# Patient Record
Sex: Male | Born: 1935 | Race: White | Hispanic: No | Marital: Single | Smoking: Never smoker
Health system: Southern US, Community
[De-identification: ages and names within clinical notes are randomized; demographics above are authoritative.]

## PROBLEM LIST (undated history)

## (undated) DIAGNOSIS — Z974 Presence of external hearing-aid: Secondary | ICD-10-CM

## (undated) DIAGNOSIS — Z85038 Personal history of other malignant neoplasm of large intestine: Secondary | ICD-10-CM

## (undated) DIAGNOSIS — E039 Hypothyroidism, unspecified: Secondary | ICD-10-CM

## (undated) DIAGNOSIS — E785 Hyperlipidemia, unspecified: Secondary | ICD-10-CM

## (undated) DIAGNOSIS — Z972 Presence of dental prosthetic device (complete) (partial): Secondary | ICD-10-CM

## (undated) DIAGNOSIS — E079 Disorder of thyroid, unspecified: Secondary | ICD-10-CM

## (undated) DIAGNOSIS — N400 Enlarged prostate without lower urinary tract symptoms: Secondary | ICD-10-CM

## (undated) DIAGNOSIS — H532 Diplopia: Secondary | ICD-10-CM

## (undated) HISTORY — DX: Hyperlipidemia, unspecified: E78.5

## (undated) HISTORY — DX: Disorder of thyroid, unspecified: E07.9

## (undated) HISTORY — PX: HERNIA REPAIR: SHX51

## (undated) HISTORY — DX: Diplopia: H53.2

## (undated) HISTORY — DX: Benign prostatic hyperplasia without lower urinary tract symptoms: N40.0

## (undated) HISTORY — DX: Personal history of other malignant neoplasm of large intestine: Z85.038

---

## 1984-12-25 DIAGNOSIS — H532 Diplopia: Secondary | ICD-10-CM

## 1984-12-25 HISTORY — DX: Diplopia: H53.2

## 1991-12-26 HISTORY — PX: TURP VAPORIZATION: SUR1397

## 1999-03-26 HISTORY — PX: PARTIAL COLECTOMY: SHX5273

## 2006-05-08 ENCOUNTER — Ambulatory Visit: Payer: Self-pay | Admitting: Internal Medicine

## 2006-06-07 ENCOUNTER — Ambulatory Visit: Payer: Self-pay | Admitting: Internal Medicine

## 2006-10-03 ENCOUNTER — Ambulatory Visit: Payer: Self-pay | Admitting: Internal Medicine

## 2007-05-10 ENCOUNTER — Telehealth: Payer: Self-pay | Admitting: Internal Medicine

## 2007-05-17 DIAGNOSIS — E039 Hypothyroidism, unspecified: Secondary | ICD-10-CM | POA: Insufficient documentation

## 2007-05-17 DIAGNOSIS — Z85038 Personal history of other malignant neoplasm of large intestine: Secondary | ICD-10-CM | POA: Insufficient documentation

## 2007-05-17 DIAGNOSIS — N400 Enlarged prostate without lower urinary tract symptoms: Secondary | ICD-10-CM | POA: Insufficient documentation

## 2007-05-17 DIAGNOSIS — E785 Hyperlipidemia, unspecified: Secondary | ICD-10-CM

## 2007-05-22 ENCOUNTER — Ambulatory Visit: Payer: Self-pay | Admitting: Internal Medicine

## 2007-05-23 LAB — CONVERTED CEMR LAB
Free T4: 0.8 ng/dL (ref 0.6–1.6)
TSH: 0.18 microintl units/mL — ABNORMAL LOW (ref 0.35–5.50)

## 2007-06-07 ENCOUNTER — Encounter: Payer: Self-pay | Admitting: Internal Medicine

## 2007-06-17 ENCOUNTER — Ambulatory Visit: Payer: Self-pay | Admitting: General Surgery

## 2007-06-17 ENCOUNTER — Encounter: Payer: Self-pay | Admitting: Internal Medicine

## 2007-06-20 ENCOUNTER — Encounter: Payer: Self-pay | Admitting: Internal Medicine

## 2007-06-26 ENCOUNTER — Encounter: Payer: Self-pay | Admitting: Internal Medicine

## 2007-07-05 ENCOUNTER — Encounter: Payer: Self-pay | Admitting: Internal Medicine

## 2007-07-08 ENCOUNTER — Ambulatory Visit: Payer: Self-pay | Admitting: General Surgery

## 2007-07-08 ENCOUNTER — Other Ambulatory Visit: Payer: Self-pay

## 2007-07-15 ENCOUNTER — Encounter: Payer: Self-pay | Admitting: Internal Medicine

## 2007-07-15 ENCOUNTER — Ambulatory Visit: Payer: Self-pay | Admitting: General Surgery

## 2007-07-29 ENCOUNTER — Encounter: Payer: Self-pay | Admitting: Internal Medicine

## 2007-10-14 ENCOUNTER — Ambulatory Visit: Payer: Self-pay | Admitting: Internal Medicine

## 2008-02-07 ENCOUNTER — Encounter: Payer: Self-pay | Admitting: Internal Medicine

## 2008-05-22 ENCOUNTER — Ambulatory Visit: Payer: Self-pay | Admitting: Internal Medicine

## 2008-05-25 LAB — CONVERTED CEMR LAB
BUN: 18 mg/dL (ref 6–23)
Bilirubin, Direct: 0.1 mg/dL (ref 0.0–0.3)
Chloride: 110 meq/L (ref 96–112)
Eosinophils Absolute: 0.2 10*3/uL (ref 0.0–0.7)
Eosinophils Relative: 3 % (ref 0.0–5.0)
Free T4: 1 ng/dL (ref 0.6–1.6)
GFR calc Af Amer: 104 mL/min
GFR calc non Af Amer: 86 mL/min
HCT: 41.1 % (ref 39.0–52.0)
HDL: 29.1 mg/dL — ABNORMAL LOW (ref >39.0)
MCV: 93.1 fL (ref 78.0–100.0)
Monocytes Absolute: 0.7 10*3/uL (ref 0.1–1.0)
Neutro Abs: 3.6 10*3/uL (ref 1.4–7.7)
Platelets: 174 10*3/uL (ref 150–400)
Potassium: 4.5 meq/L (ref 3.5–5.1)
RDW: 12.6 % (ref 11.5–14.6)
Sodium: 142 meq/L (ref 135–145)
TSH: 0.21 microintl units/mL — ABNORMAL LOW (ref 0.35–5.50)
Total Protein: 6.9 g/dL (ref 6.0–8.3)
Triglycerides: 138 mg/dL (ref 0–149)
WBC: 5.5 10*3/uL (ref 4.5–10.5)

## 2008-09-23 ENCOUNTER — Ambulatory Visit: Payer: Self-pay | Admitting: Internal Medicine

## 2008-12-21 ENCOUNTER — Encounter: Payer: Self-pay | Admitting: Internal Medicine

## 2009-01-29 ENCOUNTER — Ambulatory Visit: Payer: Self-pay | Admitting: Internal Medicine

## 2009-01-29 DIAGNOSIS — H9190 Unspecified hearing loss, unspecified ear: Secondary | ICD-10-CM | POA: Insufficient documentation

## 2009-02-01 LAB — CONVERTED CEMR LAB
ALT: 18 units/L (ref 0–53)
AST: 23 units/L (ref 0–37)
Alkaline Phosphatase: 99 units/L (ref 39–117)
CO2: 22 meq/L (ref 19–32)
Creatinine, Ser: 0.79 mg/dL (ref 0.40–1.50)
Free T4: 0.85 ng/dL — ABNORMAL LOW (ref 0.89–1.80)
LDL Cholesterol: 47 mg/dL (ref 0–99)
Sodium: 140 meq/L (ref 135–145)
TSH: 0.645 microintl units/mL (ref 0.350–4.50)
Total Bilirubin: 0.8 mg/dL (ref 0.3–1.2)
Total CHOL/HDL Ratio: 3.9
Total Protein: 7.6 g/dL (ref 6.0–8.3)
VLDL: 47 mg/dL — ABNORMAL HIGH (ref 0–40)

## 2009-07-05 ENCOUNTER — Telehealth: Payer: Self-pay | Admitting: Internal Medicine

## 2009-09-30 ENCOUNTER — Ambulatory Visit: Payer: Self-pay | Admitting: Internal Medicine

## 2010-02-01 ENCOUNTER — Ambulatory Visit: Payer: Self-pay | Admitting: Internal Medicine

## 2010-02-09 LAB — CONVERTED CEMR LAB
ALT: 23 units/L (ref 0–53)
AST: 31 units/L (ref 0–37)
Albumin: 4 g/dL (ref 3.5–5.2)
Alkaline Phosphatase: 76 units/L (ref 39–117)
BUN: 15 mg/dL (ref 6–23)
Basophils Absolute: 0 10*3/uL (ref 0.0–0.1)
Basophils Relative: 0.2 % (ref 0.0–3.0)
Chloride: 109 meq/L (ref 96–112)
Eosinophils Absolute: 0.1 10*3/uL (ref 0.0–0.7)
Free T4: 0.9 ng/dL (ref 0.6–1.6)
GFR calc non Af Amer: 85.61 mL/min (ref >60)
Glucose, Bld: 107 mg/dL — ABNORMAL HIGH (ref 70–99)
HDL: 38.6 mg/dL — ABNORMAL LOW (ref >39.00)
Lymphocytes Relative: 17.9 % (ref 12.0–46.0)
MCHC: 33.3 g/dL (ref 30.0–36.0)
MCV: 97 fL (ref 78.0–100.0)
Monocytes Absolute: 0.5 10*3/uL (ref 0.1–1.0)
Neutrophils Relative %: 68.8 % (ref 43.0–77.0)
Phosphorus: 2.3 mg/dL (ref 2.3–4.6)
Potassium: 4.5 meq/L (ref 3.5–5.1)
RDW: 12.6 % (ref 11.5–14.6)
Total Bilirubin: 1.3 mg/dL — ABNORMAL HIGH (ref 0.3–1.2)
Total CHOL/HDL Ratio: 3

## 2010-06-14 ENCOUNTER — Telehealth: Payer: Self-pay | Admitting: Internal Medicine

## 2010-06-17 ENCOUNTER — Encounter: Payer: Self-pay | Admitting: Internal Medicine

## 2010-06-17 ENCOUNTER — Telehealth: Payer: Self-pay | Admitting: Internal Medicine

## 2010-07-08 ENCOUNTER — Ambulatory Visit: Payer: Self-pay | Admitting: General Surgery

## 2010-07-08 ENCOUNTER — Encounter: Payer: Self-pay | Admitting: Internal Medicine

## 2010-10-25 ENCOUNTER — Ambulatory Visit: Payer: Self-pay | Admitting: Family Medicine

## 2010-10-25 DIAGNOSIS — J069 Acute upper respiratory infection, unspecified: Secondary | ICD-10-CM | POA: Insufficient documentation

## 2011-01-24 NOTE — Progress Notes (Signed)
Summary: clarification on levothyroxine  Phone Note From Pharmacy   Caller: [facility] Summary of Call: [facility] is asking for clarification on levothyroxine script.  Form is on your desk for signature. Initial call taken by: Lowella Petties CMA,  June 17, 2010 12:12 PM  Follow-up for Phone Call        original cut off corrected and signed Follow-up by: Cindee Salt MD,  June 17, 2010 1:39 PM  Additional Follow-up for Phone Call Additional follow up Details #1::        Form faxed back to [facility] Additional Follow-up by: Mervin Hack CMA Duncan Dull),  June 17, 2010 3:17 PM

## 2011-01-24 NOTE — Procedures (Signed)
Summary: Colonoscopy by Dr[facility]Jeffrey [facility]  Colonoscopy by Dr.Jeffrey Byrnett,[facility]   Imported By: Beau Fanny 07/13/2010 16:29:16  _____________________________________________________________________  External Attachment:    Type:   Image     Comment:   External Document  Appended Document: Colonoscopy by Dr.Jeffrey [facility]    Clinical Lists Changes  Observations: Added new observation of COLONOSCOPY: Results: Diverticulosis.        No evidence of recurrent cancer Anastamosis normal (07/08/2010 19:10)       Colonoscopy  Procedure date:  07/08/2010  Findings:      Results: Diverticulosis.        No evidence of recurrent cancer Anastamosis normal

## 2011-01-24 NOTE — Assessment & Plan Note (Signed)
Summary: ONE YEAR FOLLOW UP / LFW   Vital Signs:  Patient profile:   77 year old male Height:      67 inches Weight:      187 pounds BMI:     29.39 Temp:     98.4 degrees F oral Pulse rate:   68 / minute Pulse rhythm:   irregular BP sitting:   128 / 68  (left arm) Cuff size:   large  Vitals Entered By: Mervin Hack CMA Duncan Dull) (February 01, 2010 9:20 AM) CC: follow-up visit   History of Present Illness: Doing fine Occ gets dizzy if he jumps up out of bed or chair some trouble if he bends over too far Takes daily walk in AM  Voids okay nocturia x 1-2 No urgency or frequency in day  Still on chol med LDL was 47 on blood work discussed the uncertainty about primary prevention he wishes to continue  thyroid borderline low last year will increase this year if still down generally doesn't miss doese  Allergies: No Known Drug Allergies  Past History:  Past medical, surgical, family and social histories (including risk factors) reviewed for relevance to current acute and chronic problems.  Past Medical History: Reviewed history from 05/17/2007 and no changes required. Colon cancer, hx of Hyperlipidemia Hypothyroidism Benign prostatic hypertrophy  Past Surgical History: Reviewed history from 05/22/2008 and no changes required. Partial colectomy (Byrnett)  03/1999  chemo with Choksi TURP 1993 Concussion/diplopia 1986 Left inguinal hernia  7/08  Family History: Reviewed history from 05/17/2007 and no changes required. Dad died @66  MI Mom died @59  MI 1 brother deceased 3 sisters--1 with Altzheimers, 1 died at birth, 1 died of DM & ?liver failure  Social History: Reviewed history from 05/22/2008 and no changes required. 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   Neck:  supple, no masses, no thyromegaly, no carotid bruits, and no cervical lymphadenopathy.   Lungs:  normal respiratory effort and normal breath sounds.   Heart:  normal rate, regular rhythm, no murmur, and no gallop.   Abdomen:  soft and non-tender.   Msk:  no joint tenderness.   Very slight synovial thickening in DIP and PIPs Pulses:  1+ in feet Extremities:  no edema Neurologic:  alert & oriented X3, strength normal in all extremities, and gait normal.   Psych:  normally interactive, good eye contact, not anxious appearing, and not depressed appearing.     Impression & Recommendations:  Problem # 1:  HYPOTHYROIDISM (ICD-244.9) Assessment Unchanged  may need increase will check labs  His updated medication list for this problem includes:    Levothyroxine Sodium 100 Mcg Tabs (Levothyroxine sodium) .Marland Kitchen... 1 daily  Labs Reviewed: TSH: 0.645 (01/29/2009)    Chol: 126 (01/29/2009)   HDL: 32 (01/29/2009)   LDL: 47 (01/29/2009)   TG: 235 (01/29/2009)  Orders: TLB-T4 (Thyrox), Free [phone]) TLB-TSH (Thyroid Stimulating Hormone) (84443-TSH) TLB-CBC Platelet - w/Differential (85025-CBCD) TLB-Renal Function Panel (80069-RENAL)  Problem # 2:  BENIGN PROSTATIC HYPERTROPHY (ICD-600.00) Assessment: Unchanged voids okay without meds  Problem # 3:  HYPERLIPIDEMIA (ICD-272.4) Assessment: Unchanged  he is comfortable continuing meds discussed that 1/2 tab daily may be enough  His updated medication list for this problem includes:    Simvastatin 40 Mg Tabs (Simvastatin) .Marland Kitchen... 1 daily  Labs Reviewed: SGOT: 23 (01/29/2009)   SGPT: 18 (01/29/2009)   HDL:32 (01/29/2009), 29.1 (05/22/2008)  LDL:47 (01/29/2009), 56 (05/22/2008)  Chol:126 (01/29/2009), 113 (05/22/2008)  Trig:235 (01/29/2009), 138 (05/22/2008)  Orders: TLB-Lipid Panel (80061-LIPID) TLB-Hepatic/Liver Function Pnl  (80076-HEPATIC) Venipuncture (78295)  Problem # 4:  COLON CANCER, HX OF (ICD-V10.05) Assessment: Comment Only due for colonoscopy with Dr Lemar Livings this year  Complete Medication List: 1)  Simvastatin 40 Mg Tabs (Simvastatin) .Marland Kitchen.. 1 daily 2)  Levothyroxine Sodium 100 Mcg Tabs (Levothyroxine sodium) .Marland Kitchen.. 1 daily 3)  Aspirin 81 Mg Tbec (Aspirin) .... Take 1 by mouth once daily  Patient Instructions: 1)  Please schedule a follow-up appointment in 1 year.   Current Allergies (reviewed today): No known allergies

## 2011-01-24 NOTE — Medication Information (Signed)
Summary: Clarification/[facility]  Clarification/[facility]   Imported By: Lester Berwind 06/23/2010 12:01:14  _____________________________________________________________________  External Attachment:    Type:   Image     Comment:   External Document

## 2011-01-24 NOTE — Progress Notes (Signed)
Summary:  LEVOTHYROXINE SODIUM 100 MCG  Phone Note Refill Request Call back at Home Phone [phone] Message from:  Patient on June 14, 2010 4:40 PM  Refills Requested: Medication #1:  LEVOTHYROXINE SODIUM 100 MCG TABS 1 daily  Method Requested: Pick up at Office Initial call taken by: Melody Comas,  June 14, 2010 4:40 PM  Follow-up for Phone Call        Let him know this is $10 at [facility], [employer] and [employer] okay to send off if he prefers Follow-up by: Cindee Salt MD,  June 15, 2010 7:51 AM  Additional Follow-up for Phone Call Additional follow up Details #1::        left message on machine that rx ready for pick-up  Additional Follow-up by: DeShannon Smith CMA Duncan Dull),  June 15, 2010 8:23 AM    New/Updated Medications: LEVOTHYROXINE SODIUM 100 MCG TABS (LEVOTHYROXINE SODIUM) 1 tab daily for underactive thyroid Prescriptions: LEVOTHYROXINE SODIUM 100 MCG TABS (LEVOTHYROXINE SODIUM) 1 tab daily for underactive thyroid  #90 x 3   Entered and Authorized by:   Cindee Salt MD   Signed by:   Cindee Salt MD on 06/15/2010   Method used:   Print then Give to Patient   RxID:   [id number]

## 2011-01-24 NOTE — Assessment & Plan Note (Signed)
Summary: COLD/DLO   Vital Signs:  Patient profile:   77 year old male Weight:      184 pounds Temp:     99.1 degrees F oral Pulse rate:   80 / minute Pulse rhythm:   regular BP sitting:   136 / 78  (left arm) Cuff size:   large  Vitals Entered By: Selena Batten Dance CMA (AAMA) (October 25, 2010 3:23 PM) CC: Cold symptoms x1 week   History of Present Illness: CC: cold  8-9 d h/o cold sxs.  At night bad cough, subsides in daytime. Phlegm still present but resolving on own.  Cough productive of sputum.  + nasal congestion.  + sneezing.    No tooth pain, ear pain.  Mild chills, no fevers.  No ST, abd pain, n/v, rash, joint or muscle pain.  Taking tylenol extra strength.  No h/o alleriges or asthma.  + sick contacts at mall.  no smokers at home.  Lives alone with dog.    Current Medications (verified): 1)  Simvastatin 40 Mg Tabs (Simvastatin) .Marland Kitchen.. 1 Daily 2)  Levothyroxine Sodium 100 Mcg Tabs (Levothyroxine Sodium) .Marland Kitchen.. 1 Tab Daily For Underactive Thyroid 3)  Aspirin 81 Mg Tbec (Aspirin) .... 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   Head:  Normocephalic and atraumatic without obvious abnormalities. No apparent alopecia or balding.  no sinus tenderness Eyes:  No corneal or conjunctival inflammation noted. EOMI. Perrla. Ears:  External ear exam shows no significant lesions or deformities.  Otoscopic examination reveals clear canals, tympanic membranes are intact bilaterally without bulging, retraction, inflammation or  discharge. Hearing is grossly normal bilaterally. Nose:  External nasal examination shows no deformity or inflammation. Nasal mucosa are pink and moist without lesions or exudates. Mouth:  Oral mucosa and oropharynx without lesions or exudates.  Teeth in good repair. Neck:  supple, no masses, no thyromegaly, no carotid bruits, and no cervical lymphadenopathy.   Lungs:  Normal respiratory effort, chest expands symmetrically. Lungs are clear to auscultation, no crackles or wheezes. Heart:  normal rate, regular rhythm, no murmur, and no gallop.   Abdomen:  soft and non-tender.   Pulses:  2+ rad pulses Extremities:  good cap refill, no cyanosis Skin:  Intact without suspicious lesions or rashes   Impression & Recommendations:  Problem # 1:  VIRAL URI (ICD-465.9) given going on for 8 days, WASP script in case not better by weekend.  supportive care for now as per instructions.  red flags to return discussed  His updated medication list for this problem includes:    Aspirin 81 Mg Tbec (Aspirin) .Marland Kitchen... Take 1 by mouth once daily  Complete Medication List: 1)  Simvastatin 40 Mg Tabs (Simvastatin) .Marland Kitchen.. 1 daily 2)  Levothyroxine Sodium 100 Mcg Tabs (Levothyroxine sodium) .Marland Kitchen.. 1 tab daily for underactive thyroid 3)  Aspirin 81 Mg Tbec (Aspirin) .... Take 1 by mouth once daily 4)  Zithromax Z-pak 250 Mg Tabs (Azithromycin) .... Take one as directed  Patient Instructions: 1)  Sounds like you have a viral upper respiratory infection. 2)  Antibiotics are not needed for this.  Viral infections usually take 7-10 days to resolve.  The cough can last up to 4-6 weeks to go away.  Prescription to hold on to in case not improved by end of week. 3)  Nasal saline spray.  could try some mucinex to help bring up sputum in cough. 4)  Increase fluids and get plenty of rest. 5)  Please return if you are not improving as expected, or if you have high fevers (>101.5) or difficulty swallowing. 6)  Call clinic with  questions.  Pleasure to see you today!  Prescriptions: ZITHROMAX Z-PAK 250 MG TABS (AZITHROMYCIN) take one as directed  #1 x 0   Entered and Authorized by:   Eustaquio Boyden  MD   Signed by:   Eustaquio Boyden  MD on 10/25/2010   Method used:   Print then Give to Patient   RxID:   [id number]    Orders Added: 1)  Est. Patient Level III [56213]    Current Allergies (reviewed today): No known allergies

## 2011-02-08 ENCOUNTER — Ambulatory Visit (INDEPENDENT_AMBULATORY_CARE_PROVIDER_SITE_OTHER): Payer: Medicare Other | Admitting: Internal Medicine

## 2011-02-08 ENCOUNTER — Other Ambulatory Visit: Payer: Self-pay | Admitting: Internal Medicine

## 2011-02-08 ENCOUNTER — Encounter: Payer: Self-pay | Admitting: Internal Medicine

## 2011-02-08 DIAGNOSIS — E039 Hypothyroidism, unspecified: Secondary | ICD-10-CM

## 2011-02-08 DIAGNOSIS — E785 Hyperlipidemia, unspecified: Secondary | ICD-10-CM

## 2011-02-08 DIAGNOSIS — N400 Enlarged prostate without lower urinary tract symptoms: Secondary | ICD-10-CM

## 2011-02-08 LAB — RENAL FUNCTION PANEL
BUN: 15 mg/dL (ref 6–23)
CO2: 30 mEq/L (ref 19–32)
Creatinine, Ser: 0.9 mg/dL (ref 0.4–1.5)
GFR: 87.65 mL/min (ref >60.00)
Glucose, Bld: 91 mg/dL (ref 70–99)
Phosphorus: 2.8 mg/dL (ref 2.3–4.6)

## 2011-02-08 LAB — LIPID PANEL
HDL: 33 mg/dL — ABNORMAL LOW (ref >39.00)
LDL Cholesterol: 62 mg/dL (ref 0–99)
Total CHOL/HDL Ratio: 4
VLDL: 39.2 mg/dL (ref 0.0–40.0)

## 2011-02-08 LAB — HEPATIC FUNCTION PANEL
AST: 22 U/L (ref 0–37)
Alkaline Phosphatase: 84 U/L (ref 39–117)
Bilirubin, Direct: 0.2 mg/dL (ref 0.0–0.3)

## 2011-02-08 LAB — TSH: TSH: 0.66 u[IU]/mL (ref 0.35–5.50)

## 2011-02-09 LAB — CBC WITH DIFFERENTIAL/PLATELET
Basophils Absolute: 0 10*3/uL (ref 0.0–0.1)
Eosinophils Absolute: 0.2 10*3/uL (ref 0.0–0.7)
HCT: 39.9 % (ref 39.0–52.0)
Hemoglobin: 13.9 g/dL (ref 13.0–17.0)
Lymphs Abs: 1 10*3/uL (ref 0.7–4.0)
MCHC: 34.9 g/dL (ref 30.0–36.0)
MCV: 94.9 fl (ref 78.0–100.0)
Monocytes Absolute: 0.6 10*3/uL (ref 0.1–1.0)
Neutro Abs: 3.5 10*3/uL (ref 1.4–7.7)
RDW: 14.1 % (ref 11.5–14.6)

## 2011-02-15 NOTE — Assessment & Plan Note (Signed)
Summary: 1 YR F/U/CLE   Vital Signs:  Patient profile:   77 year old male Weight:      187 pounds BMI:     29.39 Temp:     98.6 degrees F oral Pulse rate:   71 / minute Pulse rhythm:   regular BP sitting:   155 / 72  (left arm) Cuff size:   large  Vitals Entered By: Mervin Hack CMA Duncan Dull) (February 08, 2011 8:34 AM) CC: follow-up visit   History of Present Illness: Doing well No new concerns  Feels he has slowed down a bit---doesn't walk as fast stays active though--walks  ~ every day  No chest pain No SOB No edema  No stomach trouble or myalgias on the statin  Voids okay Nocturia x 2  usually No sig daytime problems  Allergies: No Known Drug Allergies  Past History:  Past medical, surgical, family and social histories (including risk factors) reviewed for relevance to current acute and chronic problems.  Past Medical History: Reviewed history from 05/17/2007 and no changes required. Colon cancer, hx of Hyperlipidemia Hypothyroidism Benign prostatic hypertrophy  Past Surgical History: Reviewed history from 05/22/2008 and no changes required. Partial colectomy (Byrnett)  03/1999  chemo with Choksi TURP 1993 Concussion/diplopia 1986 Left inguinal hernia  7/08  Family History: Reviewed history from 05/17/2007 and no changes required. Dad died @66  MI Mom died @59  MI 1 brother deceased 3 sisters--1 with Altzheimers, 1 died at birth, 1 died of DM & ?liver failure  Social History: Reviewed history from 05/22/2008 and no changes required. 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   Neck:  supple, no masses, no thyromegaly, no carotid bruits, and no cervical lymphadenopathy.   Lungs:  normal respiratory  effort, no intercostal retractions, no accessory muscle use, and normal breath sounds.   Heart:  normal rate, regular rhythm, no murmur, and no gallop.   Abdomen:  soft, non-tender, and no masses.   Extremities:  no edema Psych:  normally interactive, good eye contact, not anxious appearing, and not depressed appearing.     Impression & Recommendations:  Problem # 1:  HYPOTHYROIDISM (ICD-244.9) Assessment Unchanged  clinically euthyroid  His updated medication list for this problem includes:    Levothyroxine Sodium 100 Mcg Tabs (Levothyroxine sodium) .Marland Kitchen... 1 tab daily for underactive thyroid  Labs Reviewed: TSH: 0.59 (02/01/2010)    Chol: 111 (02/01/2010)   HDL: 38.60 (02/01/2010)   LDL: 43 (02/01/2010)   TG: 145.0 (02/01/2010)  Orders: TLB-T4 (Thyrox), Free [phone]) Venipuncture (40981) TLB-TSH (Thyroid Stimulating Hormone) (84443-TSH) TLB-CBC Platelet - w/Differential (85025-CBCD) TLB-Renal Function Panel (80069-RENAL)  Problem # 2:  HYPERLIPIDEMIA (ICD-272.4) Assessment: Unchanged  he would like to come off the meds discussed primary prevention If still low, will stop  His updated medication list for this problem includes:    Simvastatin 40 Mg Tabs (Simvastatin) .Marland Kitchen... Take 1/2 by mouth once daily  Labs Reviewed: SGOT: 31 (02/01/2010)   SGPT: 23 (02/01/2010)   HDL:38.60 (02/01/2010), 32 (01/29/2009)  LDL:43 (02/01/2010), 47 (01/29/2009)  Chol:111 (02/01/2010), 126 (01/29/2009)  Trig:145.0 (02/01/2010), 235 (01/29/2009)  Orders: TLB-Lipid Panel (80061-LIPID) TLB-Hepatic/Liver Function Pnl (80076-HEPATIC)  Problem # 3:  BENIGN PROSTATIC HYPERTROPHY (ICD-600.00) Assessment: Unchanged voding okay without meds  Complete Medication List:  1)  Simvastatin 40 Mg Tabs (Simvastatin) .... Take 1/2 by mouth once daily 2)  Levothyroxine Sodium 100 Mcg Tabs (Levothyroxine sodium) .Marland Kitchen.. 1 tab daily for underactive thyroid 3)  Aspirin 81 Mg Tbec (Aspirin) .... Take 1 by mouth  once daily  Patient Instructions: 1)  Please schedule a follow-up appointment in 1 year for Medicare wellness exam   Orders Added: 1)  Est. Patient Level IV [16109] 2)  TLB-T4 (Thyrox), Free [60454-UJ8J] 3)  Venipuncture [19147] 4)  TLB-TSH (Thyroid Stimulating Hormone) [84443-TSH] 5)  TLB-Lipid Panel [80061-LIPID] 6)  TLB-Hepatic/Liver Function Pnl [80076-HEPATIC] 7)  TLB-CBC Platelet - w/Differential [85025-CBCD] 8)  TLB-Renal Function Panel [80069-RENAL]    Current Allergies (reviewed today): No known allergies

## 2011-02-15 NOTE — Letter (Signed)
Summary: Nature conservation officer [employer] Wellness Visit Questionnaire   [employer] Annual Wellness Visit Questionnaire   Imported By: Beau Fanny 02/08/2011 11:29:17  _____________________________________________________________________  External Attachment:    Type:   Image     Comment:   External Document

## 2011-04-28 ENCOUNTER — Other Ambulatory Visit: Payer: Self-pay | Admitting: Internal Medicine

## 2011-08-17 ENCOUNTER — Emergency Department: Payer: Self-pay | Admitting: Emergency Medicine

## 2011-08-23 ENCOUNTER — Emergency Department: Payer: Self-pay | Admitting: Emergency Medicine

## 2012-03-08 ENCOUNTER — Encounter: Payer: Private Health Insurance - Indemnity | Admitting: Internal Medicine

## 2012-03-25 ENCOUNTER — Encounter: Payer: Self-pay | Admitting: Internal Medicine

## 2012-03-26 ENCOUNTER — Encounter: Payer: Private Health Insurance - Indemnity | Admitting: Internal Medicine

## 2012-03-29 ENCOUNTER — Other Ambulatory Visit: Payer: Self-pay | Admitting: Internal Medicine

## 2012-03-29 NOTE — Telephone Encounter (Signed)
.  left message to have patient return my call.  

## 2012-03-29 NOTE — Telephone Encounter (Addendum)
Home number is wrong number, left message with daughter to return my call.  Pt has canceled last 2 appts and he needs to be seen for refills, pt just canceled 04/08/12 appt also, rescheduled for 10/22/12, ok to fill?

## 2012-03-29 NOTE — Telephone Encounter (Signed)
Okay to fill #30 x 1 Needs appt within the next 2 months Let daughter know if you can't reach him

## 2012-04-01 MED ORDER — LEVOTHYROXINE SODIUM 100 MCG PO TABS: 100.0000 ug | ORAL_TABLET | Freq: Every day | ORAL | Status: DC

## 2012-04-01 NOTE — Telephone Encounter (Signed)
Left message to have Brantley Persons [facility]) return my call, I don't know the pharmacy pt would like these called into, can't send to [facility] for 30 day supply, needs to be a local pharmacy

## 2012-04-01 NOTE — Telephone Encounter (Signed)
Niece Brantley Persons called back and gave me correct number for her uncle, [phone] cell number, finally spoke with patient and scheduled appt, rx sent to pharmacy by e-script/ [facility]

## 2012-04-03 DIAGNOSIS — H25019 Cortical age-related cataract, unspecified eye: Secondary | ICD-10-CM | POA: Diagnosis not present

## 2012-04-08 ENCOUNTER — Encounter: Payer: Medicare Other | Admitting: Internal Medicine

## 2012-04-08 ENCOUNTER — Ambulatory Visit (INDEPENDENT_AMBULATORY_CARE_PROVIDER_SITE_OTHER): Payer: Medicare Other | Admitting: Internal Medicine

## 2012-04-08 ENCOUNTER — Encounter: Payer: Self-pay | Admitting: Internal Medicine

## 2012-04-08 VITALS — BP 120/80 | HR 70 | Temp 99.0°F | Wt 186.0 lb

## 2012-04-08 DIAGNOSIS — E785 Hyperlipidemia, unspecified: Secondary | ICD-10-CM

## 2012-04-08 DIAGNOSIS — E039 Hypothyroidism, unspecified: Secondary | ICD-10-CM

## 2012-04-08 DIAGNOSIS — N400 Enlarged prostate without lower urinary tract symptoms: Secondary | ICD-10-CM | POA: Diagnosis not present

## 2012-04-08 LAB — BASIC METABOLIC PANEL
BUN: 14 mg/dL (ref 6–23)
Creatinine, Ser: 0.9 mg/dL (ref 0.4–1.5)
GFR: 88.56 mL/min (ref >60.00)
Glucose, Bld: 107 mg/dL — ABNORMAL HIGH (ref 70–99)
Potassium: 4.3 mEq/L (ref 3.5–5.1)

## 2012-04-08 LAB — CBC WITH DIFFERENTIAL/PLATELET
Basophils Absolute: 0 10*3/uL (ref 0.0–0.1)
HCT: 41.6 % (ref 39.0–52.0)
Lymphs Abs: 1.1 10*3/uL (ref 0.7–4.0)
Monocytes Absolute: 0.5 10*3/uL (ref 0.1–1.0)
Monocytes Relative: 9.2 % (ref 3.0–12.0)
Platelets: 179 10*3/uL (ref 150.0–400.0)
RDW: 13.9 % (ref 11.5–14.6)

## 2012-04-08 LAB — HEPATIC FUNCTION PANEL
AST: 23 U/L (ref 0–37)
Bilirubin, Direct: 0 mg/dL (ref 0.0–0.3)
Total Bilirubin: 0.6 mg/dL (ref 0.3–1.2)

## 2012-04-08 LAB — TSH: TSH: 1.88 u[IU]/mL (ref 0.35–5.50)

## 2012-04-08 NOTE — Progress Notes (Signed)
  Subjective:    Patient ID: Adam Bentley, male    DOB: December 19, 1924, 78 y.o.   MRN: [id number]  HPI Doing well Still walks every day---at mall with group No falls  Stopped the cholesterol med No chest pain  No SOB No dizziness now---unless he jumps out of bed too quick  Voids fine Has stopped all beer---used to have an occ one Nocturia is only sporadic  Current Outpatient Prescriptions on File Prior to Visit  Medication Sig Dispense Refill  . levothyroxine (SYNTHROID, LEVOTHROID) 100 MCG tablet Take 1 tablet (100 mcg total) by mouth daily.  90 tablet  0    No Known Allergies  Past Medical History  Diagnosis Date  . History of colon cancer   . Hyperlipidemia   . Thyroid disease   . BPH (benign prostatic hypertrophy)   . Diplopia 1986    concussion    Past Surgical History  Procedure Date  . Partial colectomy 03/1999    (Byrnett) chemo with Choksi  . Turp vaporization 1993  . Hernia repair     No family history on file.  History   Social History  . Marital Status: Single    Spouse Name: N/A    Number of Children: N/A  . Years of Education: N/A   Occupational History  . 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 Smoking status: Never Smoker   . Smokeless tobacco: Never Used  . Alcohol Use: Yes  . Drug Use: No  . Sexually Active: Not on file   Other Topics Concern  . Not on file   Social History Narrative   Moved here from [place] to care for demented sister ( now in nursing home)   Review of Systems Trying to eat well---cut down on sweets Weight stable Sleeps well--did try some melatonin when visiting nephew in [place]    Objective:   Physical Exam  Constitutional: He appears well-developed and well-nourished. No distress.  Neck: Normal range of motion. Neck supple. No thyromegaly present.  Cardiovascular: Normal rate, regular rhythm and normal heart sounds.  Exam reveals no gallop.   No murmur  heard. Pulmonary/Chest: Effort normal and breath sounds normal. No respiratory distress. He has no wheezes. He has no rales.  Abdominal: Soft. There is no tenderness.  Musculoskeletal: He exhibits no edema and no tenderness.  Lymphadenopathy:    He has no cervical adenopathy.  Psychiatric: He has a normal mood and affect. His behavior is normal. Thought content normal.          Assessment & Plan:

## 2012-04-08 NOTE — Assessment & Plan Note (Signed)
Voiding okay without meds 

## 2012-04-08 NOTE — Assessment & Plan Note (Signed)
Will check levels off the med

## 2012-04-08 NOTE — Assessment & Plan Note (Signed)
Seems to be euthyroid Will check labs 

## 2012-04-10 ENCOUNTER — Encounter: Payer: Self-pay | Admitting: *Deleted

## 2012-06-27 ENCOUNTER — Other Ambulatory Visit: Payer: Self-pay | Admitting: Internal Medicine

## 2012-10-01 DIAGNOSIS — Z23 Encounter for immunization: Secondary | ICD-10-CM | POA: Diagnosis not present

## 2012-10-16 DIAGNOSIS — D3190 Benign neoplasm of unspecified part of unspecified eye: Secondary | ICD-10-CM | POA: Diagnosis not present

## 2012-10-16 DIAGNOSIS — H0019 Chalazion unspecified eye, unspecified eyelid: Secondary | ICD-10-CM | POA: Diagnosis not present

## 2012-10-22 ENCOUNTER — Encounter: Payer: Private Health Insurance - Indemnity | Admitting: Internal Medicine

## 2012-10-30 DIAGNOSIS — D3190 Benign neoplasm of unspecified part of unspecified eye: Secondary | ICD-10-CM | POA: Diagnosis not present

## 2012-10-30 DIAGNOSIS — H0019 Chalazion unspecified eye, unspecified eyelid: Secondary | ICD-10-CM | POA: Diagnosis not present

## 2013-04-16 ENCOUNTER — Ambulatory Visit (INDEPENDENT_AMBULATORY_CARE_PROVIDER_SITE_OTHER): Payer: Medicare Other | Admitting: Internal Medicine

## 2013-04-16 ENCOUNTER — Encounter: Payer: Self-pay | Admitting: Internal Medicine

## 2013-04-16 VITALS — BP 158/70 | HR 64 | Temp 98.5°F | Wt 179.0 lb

## 2013-04-16 DIAGNOSIS — H9190 Unspecified hearing loss, unspecified ear: Secondary | ICD-10-CM | POA: Diagnosis not present

## 2013-04-16 DIAGNOSIS — E039 Hypothyroidism, unspecified: Secondary | ICD-10-CM

## 2013-04-16 DIAGNOSIS — N400 Enlarged prostate without lower urinary tract symptoms: Secondary | ICD-10-CM | POA: Diagnosis not present

## 2013-04-16 DIAGNOSIS — Z7189 Other specified counseling: Secondary | ICD-10-CM | POA: Insufficient documentation

## 2013-04-16 DIAGNOSIS — R7301 Impaired fasting glucose: Secondary | ICD-10-CM | POA: Insufficient documentation

## 2013-04-16 DIAGNOSIS — Z0000 Encounter for general adult medical examination without abnormal findings: Secondary | ICD-10-CM

## 2013-04-16 DIAGNOSIS — H2510 Age-related nuclear cataract, unspecified eye: Secondary | ICD-10-CM | POA: Diagnosis not present

## 2013-04-16 LAB — HEPATIC FUNCTION PANEL
ALT: 24 U/L (ref 0–53)
AST: 27 U/L (ref 0–37)
Albumin: 4.2 g/dL (ref 3.5–5.2)
Total Protein: 7.5 g/dL (ref 6.0–8.3)

## 2013-04-16 LAB — BASIC METABOLIC PANEL
BUN: 16 mg/dL (ref 6–23)
CO2: 28 mEq/L (ref 19–32)
Chloride: 105 mEq/L (ref 96–112)
Potassium: 4.5 mEq/L (ref 3.5–5.1)

## 2013-04-16 LAB — CBC WITH DIFFERENTIAL/PLATELET
Basophils Relative: 0.2 % (ref 0.0–3.0)
Eosinophils Relative: 1.5 % (ref 0.0–5.0)
HCT: 42.2 % (ref 39.0–52.0)
Lymphs Abs: 1.1 10*3/uL (ref 0.7–4.0)
MCHC: 34.4 g/dL (ref 30.0–36.0)
MCV: 93.8 fl (ref 78.0–100.0)
Monocytes Absolute: 0.7 10*3/uL (ref 0.1–1.0)
RBC: 4.5 Mil/uL (ref 4.22–5.81)
WBC: 5.8 10*3/uL (ref 4.5–10.5)

## 2013-04-16 LAB — T4, FREE: Free T4: 1.02 ng/dL (ref 0.60–1.60)

## 2013-04-16 LAB — HEMOGLOBIN A1C: Hgb A1c MFr Bld: 6 % (ref 4.6–6.5)

## 2013-04-16 MED ORDER — LEVOTHYROXINE SODIUM 100 MCG PO TABS: 100.0000 ug | ORAL_TABLET | Freq: Every day | ORAL | Status: DC

## 2013-04-16 NOTE — Assessment & Plan Note (Signed)
Does okay with his hearing aides

## 2013-04-16 NOTE — Assessment & Plan Note (Signed)
Has lost some weight Will recheck

## 2013-04-16 NOTE — Patient Instructions (Signed)
Please change to single vision lenses and reading glasses.

## 2013-04-16 NOTE — Progress Notes (Signed)
Subjective:    Patient ID: Adam Bentley Bentley, male    DOB: 18-Apr-1924, 79 y.o.   MRN: [id number]  HPI Here for Medicare wellness visit and follow up Doesn't see other doctors-- other than for hearing aides and Dr Adam Bentley Bentley for eyes Discussed changing to single vision lenses and reading glasses No depression or anhedonia 1 fall without injury Tries to walk regularly Reviewed advanced directives Vision okay Satisfied with his hearing aides Independent with ADLs and instrumental ADLs nonsmoker  Continues on the thyroid med Bowels have been fine No skin rashes or changes  Voids okay Nocturia x 1 usually No sig daytime frequency or urgency Does have occ dribbling after  No chest pain Gets DOE with walking up hill---this is stable No edema  Has cut out sugar to some degree Working on eating right  Current Outpatient Prescriptions on File Prior to Visit  Medication Sig Dispense Refill  . levothyroxine (SYNTHROID, LEVOTHROID) 100 MCG tablet TAKE 1 TABLET DAILY  90 tablet  3   No current facility-administered medications on file prior to visit.    No Known Allergies  Past Medical History  Diagnosis Date  . History of colon cancer   . Hyperlipidemia   . Thyroid disease   . BPH (benign prostatic hypertrophy)   . Diplopia 1986    concussion    Past Surgical History  Procedure Laterality Date  . Partial colectomy  03/1999    (Adam Bentley Bentley) chemo with Adam Bentley Bentley  . Turp vaporization  1993  . Hernia repair      No family history on file.  History   Social History  . Marital Status: Single    Spouse Name: N/A    Number of Children: N/A  . Years of Education: N/A   Occupational History  . 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 Smoking status: Never Smoker   . Smokeless tobacco: Never Used  . Alcohol Use: Yes  . Drug Use: No  . Sexually Active: Not on file   Other Topics Concern  . Not on file   Social History Narrative   Moved  here from [place] to care for demented sister ( now in nursing home)      Has living will    Requests niece Adam Bentley Bentley or nephew Adam Bentley Bentley as health care power of attorney   Has DNR already from me   No tube feeds if cognitively unaware (unless clearly seems reversible)   Review of Systems Appetite is good---watching his diet Quit drinking beer, only occ wine--- weight down 7# Occasional easy bruising Sleeps welll    Objective:   Physical Exam  Constitutional: He is oriented to person, place, and time. He appears well-developed and well-nourished. No distress.  HENT:  Mouth/Throat: Oropharynx is clear and moist. No oropharyngeal exudate.  Neck: Normal range of motion. Neck supple. No thyromegaly present.  Cardiovascular: Normal rate, regular rhythm and normal heart sounds.  Exam reveals no gallop.   No murmur heard. Pulmonary/Chest: Effort normal and breath sounds normal. No respiratory distress. He has no wheezes. He has no rales.  Abdominal: Soft. There is no tenderness.  Musculoskeletal: He exhibits no edema and no tenderness.  Lymphadenopathy:    He has no cervical adenopathy.  Neurological: He is alert and oriented to person, place, and time.  President-- "Adam Bentley, Adam Bentley Bentley--then ....  His wife is going to run" [id number] D-l-o-w then d-o-r-l-w Recall 1/3  Psychiatric: He has a normal mood and affect.  His behavior is normal.          Assessment & Plan:

## 2013-04-16 NOTE — Assessment & Plan Note (Signed)
Mild symptoms only meds not indicated

## 2013-04-16 NOTE — Assessment & Plan Note (Signed)
I have personally reviewed the Medicare Annual Wellness questionnaire and have noted 1. The patient's medical and social history 2. Their use of alcohol, tobacco or illicit drugs 3. Their current medications and supplements 4. The patient's functional ability including ADL's, fall risks, home safety risks and hearing or visual             impairment. 5. Diet and physical activities 6. Evidence for depression or mood disorders  The patients weight, height, BMI and visual acuity have been recorded in the chart I have made referrals, counseling and provided education to the patient based review of the above and I have provided the pt with a written personalized care plan for preventive services.  I have provided you with a copy of your personalized plan for preventive services. Please take the time to review along with your updated medication list.  Doing fairly well Mild cognitive decline but functionally doing well---will monitor No cancer screening due to age Imms UTD but will defer zostavax

## 2013-04-16 NOTE — Assessment & Plan Note (Signed)
Seems euthryoid Will check labs 

## 2013-04-17 ENCOUNTER — Encounter: Payer: Self-pay | Admitting: *Deleted

## 2013-10-08 DIAGNOSIS — Z23 Encounter for immunization: Secondary | ICD-10-CM | POA: Diagnosis not present

## 2014-04-17 ENCOUNTER — Ambulatory Visit (INDEPENDENT_AMBULATORY_CARE_PROVIDER_SITE_OTHER): Payer: Medicare Other | Admitting: Internal Medicine

## 2014-04-17 ENCOUNTER — Encounter: Payer: Self-pay | Admitting: Internal Medicine

## 2014-04-17 VITALS — BP 140/80 | HR 66 | Temp 98.6°F | Wt 180.0 lb

## 2014-04-17 DIAGNOSIS — R7301 Impaired fasting glucose: Secondary | ICD-10-CM

## 2014-04-17 DIAGNOSIS — E039 Hypothyroidism, unspecified: Secondary | ICD-10-CM

## 2014-04-17 DIAGNOSIS — Z23 Encounter for immunization: Secondary | ICD-10-CM | POA: Diagnosis not present

## 2014-04-17 DIAGNOSIS — N400 Enlarged prostate without lower urinary tract symptoms: Secondary | ICD-10-CM | POA: Diagnosis not present

## 2014-04-17 DIAGNOSIS — Z0000 Encounter for general adult medical examination without abnormal findings: Secondary | ICD-10-CM

## 2014-04-17 LAB — CBC WITH DIFFERENTIAL/PLATELET
BASOS ABS: 0 10*3/uL (ref 0.0–0.1)
Basophils Relative: 0.3 % (ref 0.0–3.0)
EOS PCT: 2 % (ref 0.0–5.0)
Eosinophils Absolute: 0.1 10*3/uL (ref 0.0–0.7)
HCT: 44.4 % (ref 39.0–52.0)
Hemoglobin: 14.8 g/dL (ref 13.0–17.0)
Lymphocytes Relative: 19.5 % (ref 12.0–46.0)
Lymphs Abs: 1.1 10*3/uL (ref 0.7–4.0)
MCHC: 33.3 g/dL (ref 30.0–36.0)
MCV: 96.2 fl (ref 78.0–100.0)
MONOS PCT: 10.7 % (ref 3.0–12.0)
Monocytes Absolute: 0.6 10*3/uL (ref 0.1–1.0)
NEUTROS ABS: 3.7 10*3/uL (ref 1.4–7.7)
Neutrophils Relative %: 67.5 % (ref 43.0–77.0)
Platelets: 208 10*3/uL (ref 150.0–400.0)
RBC: 4.61 Mil/uL (ref 4.22–5.81)
RDW: 14.1 % (ref 11.5–14.6)
WBC: 5.5 10*3/uL (ref 4.5–10.5)

## 2014-04-17 NOTE — Assessment & Plan Note (Signed)
Voids okay Mild symptoms so no meds needed

## 2014-04-17 NOTE — Assessment & Plan Note (Signed)
I have personally reviewed the Medicare Annual Wellness questionnaire and have noted 1. The patient's medical and social history 2. Their use of alcohol, tobacco or illicit drugs 3. Their current medications and supplements 4. The patient's functional ability including ADL's, fall risks, home safety risks and hearing or visual             impairment. 5. Diet and physical activities 6. Evidence for depression or mood disorders  The patients weight, height, BMI and visual acuity have been recorded in the chart I have made referrals, counseling and provided education to the patient based review of the above and I have provided the pt with a written personalized care plan for preventive services.  I have provided you with a copy of your personalized plan for preventive services. Please take the time to review along with your updated medication list.  Doing well No cancer screening due to age Will give prevnar

## 2014-04-17 NOTE — Addendum Note (Signed)
Addended by: Despina Hidden on: 04/17/2014 12:20 PM   Modules accepted: Orders

## 2014-04-17 NOTE — Assessment & Plan Note (Signed)
Clinically euthyroid Will check labs 

## 2014-04-17 NOTE — Progress Notes (Signed)
Pre visit review using our clinic review tool, if applicable. No additional management support is needed unless otherwise documented below in the visit note. 

## 2014-04-17 NOTE — Progress Notes (Signed)
   Subjective:    Patient ID: Adam Bentley, male    DOB: May 12, 1924, 80 y.o.   MRN: [id number]  HPI Here for Medicare wellness and follow up Reviewed form No falls No depression or anhedonia Walks regularly Occasional alcohol No tobacco Only sees eye doctor and audiologist Satisfied with hearing aides Mild memory problems but no apparent progression  Energy levels are okay Weight stable No trouble with skin, hair or nails No chest pain No palpitations  Voids okay Nocturia x 1-2 No major daytime urgency or frequency No incontinence  Current Outpatient Prescriptions on File Prior to Visit  Medication Sig Dispense Refill  . levothyroxine (SYNTHROID, LEVOTHROID) 100 MCG tablet Take 1 tablet (100 mcg total) by mouth daily.  90 tablet  3   No current facility-administered medications on file prior to visit.    No Known Allergies  Past Medical History  Diagnosis Date  . History of colon cancer   . Hyperlipidemia   . Thyroid disease   . BPH (benign prostatic hypertrophy)   . Diplopia 1986    concussion    Past Surgical History  Procedure Laterality Date  . Partial colectomy  03/1999    ([facility]) chemo with Choksi  . Turp vaporization  1993  . Hernia repair      No family history on file.  History   Social History  . Marital Status: Single    Spouse Name: N/A    Number of Children: N/A  . Years of Education: N/A   Occupational History  . 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 Smoking status: Never Smoker   . Smokeless tobacco: Never Used  . Alcohol Use: Yes  . Drug Use: No  . Sexual Activity: Not on file   Other Topics Concern  . Not on file   Social History Narrative   Has living will    Requests niece [name] or nephew Delfino Lovett as health care power of attorney   Has DNR already from me   No tube feeds if cognitively unaware (unless clearly seems reversible)   Review of Systems Sleeps well Bowels have been  fine No suspicious skin lesions--uses cream on rough spots on face No heartburn    Objective:   Physical Exam  Constitutional: He is oriented to person, place, and time. He appears well-developed and well-nourished. No distress.  HENT:  Mouth/Throat: Oropharynx is clear and moist. No oropharyngeal exudate.  Neck: Normal range of motion. Neck supple. No thyromegaly present.  Cardiovascular: Normal rate, regular rhythm, normal heart sounds and intact distal pulses.  Exam reveals no gallop.   No murmur heard. Pulmonary/Chest: Effort normal and breath sounds normal. No respiratory distress. He has no wheezes. He has no rales.  Abdominal: Soft. There is no tenderness.  Musculoskeletal: He exhibits no edema and no tenderness.  Lymphadenopathy:    He has no cervical adenopathy.  Neurological: He is alert and oriented to person, place, and time.  President-- "Obama, Tawni Pummel, Clinton" D-l-o-w [phone] Recall 3/3  Skin: Rash noted.  Psychiatric: He has a normal mood and affect. His behavior is normal.          Assessment & Plan:

## 2014-04-17 NOTE — Assessment & Plan Note (Signed)
Eats better Was better last year---will recheck

## 2014-04-20 LAB — COMPREHENSIVE METABOLIC PANEL
ALT: 22 U/L (ref 0–53)
AST: 29 U/L (ref 0–37)
Albumin: 4.3 g/dL (ref 3.5–5.2)
Alkaline Phosphatase: 89 U/L (ref 39–117)
BILIRUBIN TOTAL: 0.7 mg/dL (ref 0.3–1.2)
BUN: 12 mg/dL (ref 6–23)
CO2: 26 meq/L (ref 19–32)
Calcium: 10.5 mg/dL (ref 8.4–10.5)
Chloride: 102 mEq/L (ref 96–112)
Creatinine, Ser: 0.9 mg/dL (ref 0.4–1.5)
GFR: 89.33 mL/min (ref >60.00)
Glucose, Bld: 91 mg/dL (ref 70–99)
Potassium: 4.7 mEq/L (ref 3.5–5.1)
Sodium: 138 mEq/L (ref 135–145)
Total Protein: 7.6 g/dL (ref 6.0–8.3)

## 2014-04-20 LAB — T4, FREE: Free T4: 0.99 ng/dL (ref 0.60–1.60)

## 2014-04-20 LAB — TSH: TSH: 0.63 u[IU]/mL (ref 0.35–5.50)

## 2014-04-21 ENCOUNTER — Encounter: Payer: Self-pay | Admitting: Family Medicine

## 2014-05-07 ENCOUNTER — Other Ambulatory Visit: Payer: Self-pay | Admitting: *Deleted

## 2014-05-07 MED ORDER — LEVOTHYROXINE SODIUM 100 MCG PO TABS: 100.0000 ug | ORAL_TABLET | Freq: Every day | ORAL | Status: DC

## 2014-05-12 DIAGNOSIS — H25019 Cortical age-related cataract, unspecified eye: Secondary | ICD-10-CM | POA: Diagnosis not present

## 2014-05-21 DIAGNOSIS — H0019 Chalazion unspecified eye, unspecified eyelid: Secondary | ICD-10-CM | POA: Diagnosis not present

## 2014-08-13 DIAGNOSIS — H10409 Unspecified chronic conjunctivitis, unspecified eye: Secondary | ICD-10-CM | POA: Diagnosis not present

## 2014-08-13 DIAGNOSIS — C4442 Squamous cell carcinoma of skin of scalp and neck: Secondary | ICD-10-CM | POA: Diagnosis not present

## 2014-08-13 DIAGNOSIS — C44101 Unspecified malignant neoplasm of skin of unspecified eyelid, including canthus: Secondary | ICD-10-CM | POA: Diagnosis not present

## 2014-10-23 DIAGNOSIS — C44222 Squamous cell carcinoma of skin of right ear and external auricular canal: Secondary | ICD-10-CM | POA: Diagnosis not present

## 2014-10-28 DIAGNOSIS — Z23 Encounter for immunization: Secondary | ICD-10-CM | POA: Diagnosis not present

## 2015-01-27 ENCOUNTER — Other Ambulatory Visit: Payer: Self-pay

## 2015-01-27 MED ORDER — LEVOTHYROXINE SODIUM 100 MCG PO TABS: 100.0000 ug | ORAL_TABLET | Freq: Every day | ORAL | Status: DC

## 2015-01-27 NOTE — Telephone Encounter (Signed)
Pt left v/m; pt has changed mail order pharmacies to [facility] mail order pharmacy and pt request refill levothyroxine to [facility]. Pt has CPX scheduled 04/19/15. Pt notified done.

## 2015-02-01 ENCOUNTER — Telehealth: Payer: Self-pay | Admitting: Internal Medicine

## 2015-02-01 NOTE — Telephone Encounter (Signed)
Pt called to ck status of thyroid med; advised has spoken with Mickel Baas at [address]. Pt will  Ck with pharmacy  Later today.

## 2015-02-01 NOTE — Telephone Encounter (Signed)
Generic is fine 

## 2015-02-01 NOTE — Telephone Encounter (Signed)
[facility] wants to know if they are supposed to fill Synthroid for the patient or the generic for Synthroid? Please call them asap [phone]

## 2015-02-01 NOTE — Telephone Encounter (Signed)
Mickel Baas at [address] notified as instructed and voiced understanding.

## 2015-04-19 ENCOUNTER — Ambulatory Visit (INDEPENDENT_AMBULATORY_CARE_PROVIDER_SITE_OTHER): Payer: Medicare Other | Admitting: Internal Medicine

## 2015-04-19 ENCOUNTER — Encounter: Payer: Self-pay | Admitting: Internal Medicine

## 2015-04-19 VITALS — BP 136/60 | HR 66 | Temp 98.8°F | Ht 67.0 in | Wt 178.0 lb

## 2015-04-19 DIAGNOSIS — Z0000 Encounter for general adult medical examination without abnormal findings: Secondary | ICD-10-CM

## 2015-04-19 DIAGNOSIS — Z7189 Other specified counseling: Secondary | ICD-10-CM | POA: Diagnosis not present

## 2015-04-19 DIAGNOSIS — N400 Enlarged prostate without lower urinary tract symptoms: Secondary | ICD-10-CM

## 2015-04-19 DIAGNOSIS — E039 Hypothyroidism, unspecified: Secondary | ICD-10-CM | POA: Diagnosis not present

## 2015-04-19 LAB — COMPREHENSIVE METABOLIC PANEL
ALBUMIN: 4 g/dL (ref 3.5–5.2)
ALK PHOS: 96 U/L (ref 39–117)
ALT: 17 U/L (ref 0–53)
AST: 20 U/L (ref 0–37)
BUN: 14 mg/dL (ref 6–23)
CO2: 29 mEq/L (ref 19–32)
CREATININE: 0.9 mg/dL (ref 0.40–1.50)
Calcium: 9.9 mg/dL (ref 8.4–10.5)
Chloride: 107 mEq/L (ref 96–112)
GFR: 84.57 mL/min (ref >60.00)
Glucose, Bld: 82 mg/dL (ref 70–99)
Potassium: 4.3 mEq/L (ref 3.5–5.1)
Sodium: 138 mEq/L (ref 135–145)
Total Bilirubin: 0.7 mg/dL (ref 0.2–1.2)
Total Protein: 7.2 g/dL (ref 6.0–8.3)

## 2015-04-19 LAB — CBC WITH DIFFERENTIAL/PLATELET
Basophils Absolute: 0 10*3/uL (ref 0.0–0.1)
Basophils Relative: 0.5 % (ref 0.0–3.0)
EOS PCT: 2.3 % (ref 0.0–5.0)
Eosinophils Absolute: 0.1 10*3/uL (ref 0.0–0.7)
HCT: 40.4 % (ref 39.0–52.0)
Hemoglobin: 13.8 g/dL (ref 13.0–17.0)
LYMPHS ABS: 1 10*3/uL (ref 0.7–4.0)
Lymphocytes Relative: 19.8 % (ref 12.0–46.0)
MCHC: 34.2 g/dL (ref 30.0–36.0)
MCV: 93.1 fl (ref 78.0–100.0)
MONO ABS: 0.6 10*3/uL (ref 0.1–1.0)
MONOS PCT: 12.5 % — AB (ref 3.0–12.0)
Neutro Abs: 3.3 10*3/uL (ref 1.4–7.7)
Neutrophils Relative %: 64.9 % (ref 43.0–77.0)
Platelets: 192 10*3/uL (ref 150.0–400.0)
RBC: 4.34 Mil/uL (ref 4.22–5.81)
RDW: 14.1 % (ref 11.5–15.5)
WBC: 5.1 10*3/uL (ref 4.0–10.5)

## 2015-04-19 LAB — T4, FREE: Free T4: 0.99 ng/dL (ref 0.60–1.60)

## 2015-04-19 LAB — TSH: TSH: 0.21 u[IU]/mL — ABNORMAL LOW (ref 0.35–4.50)

## 2015-04-19 MED ORDER — ZOSTER VACCINE LIVE 19400 UNT/0.65ML ~~LOC~~ SOLR: 0.6500 mL | Freq: Once | SUBCUTANEOUS | Status: DC

## 2015-04-19 NOTE — Assessment & Plan Note (Signed)
I have personally reviewed the Medicare Annual Wellness questionnaire and have noted 1. The patient's medical and social history 2. Their use of alcohol, tobacco or illicit drugs 3. Their current medications and supplements 4. The patient's functional ability including ADL's, fall risks, home safety risks and hearing or visual             impairment. 5. Diet and physical activities 6. Evidence for depression or mood disorders  The patients weight, height, BMI and visual acuity have been recorded in the chart I have made referrals, counseling and provided education to the patient based review of the above and I have provided the pt with a written personalized care plan for preventive services.  I have provided you with a copy of your personalized plan for preventive services. Please take the time to review along with your updated medication list.  No cancer screening due to age Rx for zostavax--he will check price UTD on other immunizations

## 2015-04-19 NOTE — Progress Notes (Signed)
Subjective:    Patient ID: Adam Bentley, male    DOB: 09/09/1924, 81 y.o.   MRN: [id number]  HPI Here for Medicare wellness and follow up of medical conditions Reviewed form and advanced directives Moved into the [place] at [facility]-- independent living Gets 1 meal per day Still drives and independent with instrumental ADLs. Still has a Orthoptist (family dentist) and [place] for his vision Vision remains fine with glasses. Hearing is still poor Former smoker--no tobacco 1-2 drinks some days-- often none. No falls No depression or anhedonia Mild memory issues---nothing burdensome. Still does all finances  Energy levels are okay No change in hair, nails or skin Weight fairly stable  Nocturia x 1-2 No daytime urgency or frequency Stream is okay  Current Outpatient Prescriptions on File Prior to Visit  Medication Sig Dispense Refill  . aspirin 81 MG tablet Take 81 mg by mouth daily.    . cyanocobalamin 1000 MCG tablet Take 100 mcg by mouth daily.    Marland Kitchen levothyroxine (SYNTHROID, LEVOTHROID) 100 MCG tablet Take 1 tablet (100 mcg total) by mouth daily. 90 tablet 0   No current facility-administered medications on file prior to visit.    No Known Allergies  Past Medical History  Diagnosis Date  . History of colon cancer   . Hyperlipidemia   . Thyroid disease   . BPH (benign prostatic hypertrophy)   . Diplopia 1986    concussion    Past Surgical History  Procedure Laterality Date  . Partial colectomy  03/1999    ([facility]) chemo with Choksi  . Turp vaporization  1993  . Hernia repair      No family history on file.  History   Social History  . Marital Status: Single    Spouse Name: N/A  . Number of Children: N/A  . Years of Education: N/A   Occupational History  . 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 Smoking status: Never Smoker   . Smokeless tobacco: Never Used  . Alcohol Use: Yes  . Drug Use: No    . Sexual Activity: Not on file   Other Topics Concern  . Not on file   Social History Narrative   Has living will    Requests niece [name] (or nephew Delfino Lovett) as health care power of attorney   Has DNR already from me   No tube feeds if cognitively unaware (unless clearly seems reversible)   Review of Systems Gets some AM dizziness--- goes away quickly Appetite is good Weight is fairly stable Sleeps well    Objective:   Physical Exam  Constitutional: He is oriented to person, place, and time. He appears well-developed and well-nourished. No distress.  HENT:  Mouth/Throat: Oropharynx is clear and moist. No oropharyngeal exudate.  Neck: Normal range of motion. Neck supple. No thyromegaly present.  Cardiovascular: Normal rate, regular rhythm and normal heart sounds.  Exam reveals no gallop.   No murmur heard. Feet warm. Faint pulse on left, not palpable on right  Pulmonary/Chest: Effort normal and breath sounds normal. No respiratory distress. He has no wheezes. He has no rales.  Abdominal: Soft. There is no tenderness.  Musculoskeletal: He exhibits no edema or tenderness.  Lymphadenopathy:    He has no cervical adenopathy.  Neurological: He is alert and oriented to person, place, and time.  President-- "Obama, Bush, the first Bush, --then "Clinton" [id number] D-l-o-w Recall 2/3  Skin: No rash noted. No  erythema.  Psychiatric: He has a normal mood and affect. His behavior is normal.          Assessment & Plan:

## 2015-04-19 NOTE — Assessment & Plan Note (Signed)
See social history °Has DNR °

## 2015-04-19 NOTE — Assessment & Plan Note (Signed)
Seems euthyroid ?Will check labs ?

## 2015-04-19 NOTE — Progress Notes (Signed)
Pre visit review using our clinic review tool, if applicable. No additional management support is needed unless otherwise documented below in the visit note. 

## 2015-04-19 NOTE — Assessment & Plan Note (Signed)
Voids okay without meds

## 2015-04-20 ENCOUNTER — Encounter: Payer: Self-pay | Admitting: *Deleted

## 2015-06-04 ENCOUNTER — Other Ambulatory Visit: Payer: Self-pay | Admitting: Internal Medicine

## 2015-06-10 ENCOUNTER — Ambulatory Visit (INDEPENDENT_AMBULATORY_CARE_PROVIDER_SITE_OTHER): Payer: Medicare Other | Admitting: Internal Medicine

## 2015-06-10 ENCOUNTER — Encounter: Payer: Self-pay | Admitting: Internal Medicine

## 2015-06-10 ENCOUNTER — Telehealth: Payer: Self-pay | Admitting: Internal Medicine

## 2015-06-10 VITALS — BP 142/82 | HR 78 | Temp 98.6°F | Wt 176.0 lb

## 2015-06-10 DIAGNOSIS — M5412 Radiculopathy, cervical region: Secondary | ICD-10-CM | POA: Diagnosis not present

## 2015-06-10 DIAGNOSIS — R269 Unspecified abnormalities of gait and mobility: Secondary | ICD-10-CM | POA: Diagnosis not present

## 2015-06-10 NOTE — Progress Notes (Signed)
Subjective:    Patient ID: Adam Bentley, male    DOB: 11-10-1924, 81 y.o.   MRN: [id number]  HPI  Pt presents to the clinic today with c/o a tingling sensation in his right arm. This started last night. He reports it feels warm and like it has pins and needles sticking in it. He denies numbness, swelling, redness or pain. He denies any injury to the area. He has not taken anything OTC. He has never had a sensation like this before.  He also c/o of feeling "wobbly" when he walks. He does not feel weak, dizzy or lightheaded. He feels like his body is pulling him to the right when he walks. He normally does not use a walker but is using a walker today to make sure he does not fall. He denies blurred vision, difficulty with speech, difficulty swallowing, chest pain, chest tightness or shortness of breath.  Review of Systems      Past Medical History  Diagnosis Date  . History of colon cancer   . Hyperlipidemia   . Thyroid disease   . BPH (benign prostatic hypertrophy)   . Diplopia 1986    concussion    Current Outpatient Prescriptions  Medication Sig Dispense Refill  . aspirin 81 MG tablet Take 81 mg by mouth daily.    . cyanocobalamin 1000 MCG tablet Take 100 mcg by mouth daily.    Marland Kitchen levothyroxine (SYNTHROID, LEVOTHROID) 100 MCG tablet TAKE ONE TABLET BY MOUTH ONCE DAILY 90 tablet 0  . zoster vaccine live, PF, (ZOSTAVAX) 76160 UNT/0.65ML injection Inject 19,400 Units into the skin once. 1 each 0   No current facility-administered medications for this visit.    No Known Allergies  History reviewed. No pertinent family history.  History   Social History  . Marital Status: Single    Spouse Name: N/A  . Number of Children: N/A  . Years of Education: N/A   Occupational History  . 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 Smoking status: Never Smoker   . Smokeless tobacco: Never Used  . Alcohol Use: Yes  . Drug Use: No  . Sexual  Activity: Not on file   Other Topics Concern  . Not on file   Social History Narrative   Has living will    Requests niece [name] (or nephew Delfino Lovett) as health care power of attorney   Has DNR already from me   No tube feeds if cognitively unaware (unless clearly seems reversible)     Constitutional: Denies fever, malaise, fatigue, headache or abrupt weight changes.  HEENT: Denies eye pain, eye redness, ear pain, ringing in the ears, wax buildup, runny nose, nasal congestion, bloody nose, or sore throat. Respiratory: Denies difficulty breathing, shortness of breath, cough or sputum production.   Cardiovascular: Denies chest pain, chest tightness, palpitations or swelling in the hands or feet.  Musculoskeletal: Pt reports pain in right arm and difficulty walking. Denies decrease in range of motion, muscle pain or joint swelling.  Skin: Denies redness, rashes, lesions or ulcercations.  Neurological: Denies dizziness, difficulty with memory, difficulty with speech or problems with balance and coordination.   No other specific complaints in a complete review of systems (except as listed in HPI above).  Objective:   Physical Exam   BP 142/82 mmHg  Pulse 78  Temp(Src) 98.6 F (37 C) (Oral)  Wt 176 lb (79.833 kg)  SpO2 97% Wt Readings from Last 3 Encounters:  06/10/15 176 lb (79.833 kg)  04/19/15 178 lb (80.74 kg)  04/17/14 180 lb (81.647 kg)    General: Appears his stated age, in NAD. Skin: Warm, dry and intact. No redness or warmth noted of right arm. HEENT: Head: normal shape and size; Eyes: sclera white, no icterus, conjunctiva pink, PERRLA and EOMs intact; Ears: Tm's gray and intact, normal light reflex; Cardiovascular: Normal rate and rhythm. S1,S2 noted.  No murmur, rubs or gallops noted. Radial pulses 2+. Pulmonary/Chest: Normal effort and positive vesicular breath sounds. No respiratory distress. No wheezes, rales or ronchi noted.  Musculoskeletal: Normal flexion and  extension of the cervical spine. Decreased rotation likely secondary to age. No pain with palpation over the cervical spine. Normal internal and external rotation of the right shoulder. Strength 5/5 BUE. Hand grips equal. Normal flexion and extension of the knees bilaterally. Strength 5/5 BLE. Gait is straight, does not appear to be pulling to the right.  Neurological: Alert and oriented. Coordination normal.    BMET    Component Value Date/Time   NA 138 04/19/2015 1231   K 4.3 04/19/2015 1231   CL 107 04/19/2015 1231   CO2 29 04/19/2015 1231   GLUCOSE 82 04/19/2015 1231   BUN 14 04/19/2015 1231   CREATININE 0.90 04/19/2015 1231   CALCIUM 9.9 04/19/2015 1231   GFRNONAA 85.61 02/01/2010 0942   GFRAA 104 05/22/2008 0833    Lipid Panel     Component Value Date/Time   CHOL 134 02/08/2011 0904   TRIG 196.0* 02/08/2011 0904   HDL 33.00* 02/08/2011 0904   CHOLHDL 4 02/08/2011 0904   VLDL 39.2 02/08/2011 0904   LDLCALC 62 02/08/2011 0904    CBC    Component Value Date/Time   WBC 5.1 04/19/2015 1231   RBC 4.34 04/19/2015 1231   HGB 13.8 04/19/2015 1231   HCT 40.4 04/19/2015 1231   PLT 192.0 04/19/2015 1231   MCV 93.1 04/19/2015 1231   MCHC 34.2 04/19/2015 1231   RDW 14.1 04/19/2015 1231   LYMPHSABS 1.0 04/19/2015 1231   MONOABS 0.6 04/19/2015 1231   EOSABS 0.1 04/19/2015 1231   BASOSABS 0.0 04/19/2015 1231    Hgb A1C Lab Results  Component Value Date   HGBA1C 6.0 04/16/2013        Assessment & Plan:   Cervical Radiculitis:  Advised him to try 400 mg Ibuprofen BID with food for the next 24-48 hours If no improvement, will consider low dose steroids If pain persist, consider xray of cervical spine  Difficulty with gait:  His gait appears normal to me for his age Advised him to hold off on driving for the next 24-48 hours If persist, he should follow up with his PCP  RTC as needed or if symptoms persist or worsen

## 2015-06-10 NOTE — Telephone Encounter (Signed)
Pt has appt 06/10/15 at 10:30 AM with Webb Silversmith NP.

## 2015-06-10 NOTE — Progress Notes (Signed)
Pre visit review using our clinic review tool, if applicable. No additional management support is needed unless otherwise documented below in the visit note. 

## 2015-06-10 NOTE — Patient Instructions (Addendum)
Take 400 mg Ibuprofen twice a day with food for the next 24-48 hours No driving for the next 24-48 hours If worse, call me by 2:00 tomorrow afternoon Use you walker to prevent falls  Cervical Radiculopathy Cervical radiculopathy happens when a nerve in the neck is pinched or bruised by a slipped (herniated) disk or by arthritic changes in the bones of the cervical spine. This can occur due to an injury or as part of the normal aging process. Pressure on the cervical nerves can cause pain or numbness that runs from your neck all the way down into your arm and fingers. CAUSES  There are many possible causes, including:  Injury.  Muscle tightness in the neck from overuse.  Swollen, painful joints (arthritis).  Breakdown or degeneration in the bones and joints of the spine (spondylosis) due to aging.  Bone spurs that may develop near the cervical nerves. SYMPTOMS  Symptoms include pain, weakness, or numbness in the affected arm and hand. Pain can be severe or irritating. Symptoms may be worse when extending or turning the neck. DIAGNOSIS  Your caregiver will ask about your symptoms and do a physical exam. He or she may test your strength and reflexes. X-rays, CT scans, and MRI scans may be needed in cases of injury or if the symptoms do not go away after a period of time. Electromyography (EMG) or nerve conduction testing may be done to study how your nerves and muscles are working. TREATMENT  Your caregiver may recommend certain exercises to help relieve your symptoms. Cervical radiculopathy can, and often does, get better with time and treatment. If your problems continue, treatment options may include:  Wearing a soft collar for short periods of time.  Physical therapy to strengthen the neck muscles.  Medicines, such as nonsteroidal anti-inflammatory drugs (NSAIDs), oral corticosteroids, or spinal injections.  Surgery. Different types of surgery may be done depending on the cause of  your problems. HOME CARE INSTRUCTIONS   Put ice on the affected area.  Put ice in a plastic bag.  Place a towel between your skin and the bag.  Leave the ice on for 15-20 minutes, 03-04 times a day or as directed by your caregiver.  If ice does not help, you can try using heat. Take a warm shower or bath, or use a hot water bottle as directed by your caregiver.  You may try a gentle neck and shoulder massage.  Use a flat pillow when you sleep.  Only take over-the-counter or prescription medicines for pain, discomfort, or fever as directed by your caregiver.  If physical therapy was prescribed, follow your caregiver's directions.  If a soft collar was prescribed, use it as directed. SEEK IMMEDIATE MEDICAL CARE IF:   Your pain gets much worse and cannot be controlled with medicines.  You have weakness or numbness in your hand, arm, face, or leg.  You have a high fever or a stiff, rigid neck.  You lose bowel or bladder control (incontinence).  You have trouble with walking, balance, or speaking. MAKE SURE YOU:   Understand these instructions.  Will watch your condition.  Will get help right away if you are not doing well or get worse. Document Released: 09/05/2001 Document Revised: 03/04/2012 Document Reviewed: 07/25/2011 [facility] Patient Information 2015 [place], [place]. This information is not intended to replace advice given to you by your health care provider. Make sure you discuss any questions you have with your health care provider.

## 2015-06-10 NOTE — Telephone Encounter (Signed)
Patient Name: Adam Bentley  DOB: [birth year]    Initial Comment Caller states, uncle, is warm from shoulder down, and has a hard time walking today    Nurse Assessment  Nurse: Mallie Mussel, RN, Alveta Heimlich Date/Time Eilene Ghazi Time): 06/10/2015 8:15:24 AM  Confirm and document reason for call. If symptomatic, describe symptoms. ---Caller states that her uncle has a tingling warm sensation on his right arm which began last night. This morning he has difficulty walking. He is "wobbly" when he walks. Denies numbness. He is talking plainly. Denies feeling weak. Denies feeling dizzy and lightheaded. She states that he only has warmth in his right shoulder. Denies swelling and redness. Denies pain.  Has the patient traveled out of the country within the last 30 days? ---No  Does the patient require triage? ---Yes  Related visit to physician within the last 2 weeks? ---No  Does the PT have any chronic conditions? (i.e. diabetes, asthma, etc.) ---No     Guidelines    Guideline Title Affirmed Question Affirmed Notes  No Guideline or Reference Available Nursing judgment    Final Disposition User   See Physician within 4 Hours (or PCP triage) Mallie Mussel, RN, Alveta Heimlich    Comments  Appointment scheduled with Webb Silversmith NP for 10:30am today.

## 2015-06-11 ENCOUNTER — Telehealth: Payer: Self-pay

## 2015-06-11 NOTE — Telephone Encounter (Signed)
[name] pts niece (do not see DPR signed) left v/m wants to know if pt can take the advil twice a day longer than the 24 -48 hrs specified on office note 06/10/15. Pt is no worse.Please advise.

## 2015-06-11 NOTE — Telephone Encounter (Signed)
Pts niece is aware.  °

## 2015-06-11 NOTE — Telephone Encounter (Signed)
Yes he can take it up to a week if needed

## 2015-07-23 DIAGNOSIS — H2513 Age-related nuclear cataract, bilateral: Secondary | ICD-10-CM | POA: Diagnosis not present

## 2015-09-13 ENCOUNTER — Other Ambulatory Visit: Payer: Self-pay | Admitting: Internal Medicine

## 2015-09-14 ENCOUNTER — Encounter: Payer: Self-pay | Admitting: Internal Medicine

## 2015-09-14 ENCOUNTER — Ambulatory Visit (INDEPENDENT_AMBULATORY_CARE_PROVIDER_SITE_OTHER): Payer: Medicare Other | Admitting: Internal Medicine

## 2015-09-14 VITALS — BP 148/80 | HR 79 | Temp 97.5°F | Wt 179.0 lb

## 2015-09-14 DIAGNOSIS — R260 Ataxic gait: Secondary | ICD-10-CM

## 2015-09-14 DIAGNOSIS — R269 Unspecified abnormalities of gait and mobility: Secondary | ICD-10-CM

## 2015-09-14 DIAGNOSIS — R209 Unspecified disturbances of skin sensation: Secondary | ICD-10-CM | POA: Diagnosis not present

## 2015-09-14 NOTE — Progress Notes (Signed)
   Subjective:    Patient ID: Adam Bentley, male    DOB: [birth year], 81 y.o.   MRN: [id number]  HPI Here with niece [name]  Having trouble with strength and balance Got up 2 days ago while watching game, started walking and almost fell. Nephew in law caught him Had 1/2 glass of wine then Felt better fairly quickly  Right arm feels tight Hard to write with it--couldn't write name this AM Still doesn't feel right now  Trouble getting his feet lifted up to walk Has had some falls recently Using quad cane inconsistently--also with rollator (not using that either)  Still wants to drive Discussed that this is not a good idea  Current Outpatient Prescriptions on File Prior to Visit  Medication Sig Dispense Refill  . aspirin 81 MG tablet Take 81 mg by mouth daily.    . cyanocobalamin 1000 MCG tablet Take 100 mcg by mouth daily.    Marland Kitchen levothyroxine (SYNTHROID, LEVOTHROID) 100 MCG tablet TAKE ONE TABLET BY MOUTH ONCE DAILY 90 tablet 0   No current facility-administered medications on file prior to visit.    No Known Allergies  Past Medical History  Diagnosis Date  . History of colon cancer   . Hyperlipidemia   . Thyroid disease   . BPH (benign prostatic hypertrophy)   . Diplopia 1986    concussion    Past Surgical History  Procedure Laterality Date  . Partial colectomy  03/1999    ([facility]) chemo with Choksi  . Turp vaporization  1993  . Hernia repair      No family history on file.  Social History   Social History  . Marital Status: Single    Spouse Name: N/A  . Number of Children: N/A  . Years of Education: N/A   Occupational History  . 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 Smoking status: Never Smoker   . Smokeless tobacco: Never Used  . Alcohol Use: Yes  . Drug Use: No  . Sexual Activity: Not on file   Other Topics Concern  . Not on file   Social History Narrative   Has living will    Requests niece [name] (or  nephew Delfino Lovett) as health care power of attorney   Has DNR already from me   No tube feeds if cognitively unaware (unless clearly seems reversible)   Review of Systems No aphasia or dysphagia No facial droop Appetite is okay    Objective:   Physical Exam  Constitutional: He is oriented to person, place, and time. He appears well-developed and well-nourished. No distress.  Neck: Normal range of motion. Neck supple. No thyromegaly present.  No carotid bruits  Cardiovascular: Normal rate, regular rhythm and normal heart sounds.  Exam reveals no gallop.   No murmur heard. Pulmonary/Chest: Effort normal and breath sounds normal. No respiratory distress. He has no wheezes. He has no rales.  Musculoskeletal: He exhibits no edema.  Lymphadenopathy:    He has no cervical adenopathy.  Neurological: He is alert and oriented to person, place, and time. He has normal strength. He displays no tremor. No cranial nerve deficit or sensory deficit. He exhibits normal muscle tone. He displays a negative Romberg sign. Gait abnormal.  Ataxic gait          Assessment & Plan:

## 2015-09-14 NOTE — Assessment & Plan Note (Signed)
This could be a TIA but not clear cut No worrisome findings now Will hold off on workup after discussion with him and niece

## 2015-09-14 NOTE — Assessment & Plan Note (Signed)
He has had worsening problems with this Recent falls, etc Discussed NO DRIVING Needs to use the rollator

## 2015-09-14 NOTE — Progress Notes (Signed)
Pre visit review using our clinic review tool, if applicable. No additional management support is needed unless otherwise documented below in the visit note. 

## 2015-09-28 ENCOUNTER — Ambulatory Visit: Payer: Medicare Other

## 2015-10-12 DIAGNOSIS — Z23 Encounter for immunization: Secondary | ICD-10-CM | POA: Diagnosis not present

## 2015-12-27 ENCOUNTER — Other Ambulatory Visit: Payer: Self-pay | Admitting: Internal Medicine

## 2016-03-18 ENCOUNTER — Other Ambulatory Visit: Payer: Self-pay | Admitting: Internal Medicine

## 2016-04-04 ENCOUNTER — Encounter: Payer: Self-pay | Admitting: Internal Medicine

## 2016-04-04 ENCOUNTER — Ambulatory Visit (INDEPENDENT_AMBULATORY_CARE_PROVIDER_SITE_OTHER): Payer: Medicare Other | Admitting: Internal Medicine

## 2016-04-04 VITALS — BP 120/72 | HR 70 | Temp 97.4°F | Wt 174.0 lb

## 2016-04-04 DIAGNOSIS — T07XXXA Unspecified multiple injuries, initial encounter: Secondary | ICD-10-CM

## 2016-04-04 DIAGNOSIS — T07 Unspecified multiple injuries: Secondary | ICD-10-CM | POA: Diagnosis not present

## 2016-04-04 DIAGNOSIS — R269 Unspecified abnormalities of gait and mobility: Secondary | ICD-10-CM

## 2016-04-04 DIAGNOSIS — R260 Ataxic gait: Secondary | ICD-10-CM

## 2016-04-04 NOTE — Progress Notes (Signed)
Pre visit review using our clinic review tool, if applicable. No additional management support is needed unless otherwise documented below in the visit note. 

## 2016-04-04 NOTE — Assessment & Plan Note (Signed)
Fortunately minor Finger may need some splinting and could have permanent tendon damage--but without tenderness, will wait till swelling is down to decide Consider sports medicine eval for possible injection prn

## 2016-04-04 NOTE — Progress Notes (Signed)
   Subjective:    Patient ID: Adam Bentley, male    DOB: 1924-03-18, 82 y.o.   MRN: [id number]  HPI Here due to problems after a fall With niece Adam Bentley walking all the way to Home from [employer] Was using rollator at the time Does have cane sometimes--but not all the time  Injury to left cheek Left 3rd finger injured as well Was treated by nurse at [facility] stitches to cheek Other wounds cleaned  Current Outpatient Prescriptions on File Prior to Visit  Medication Sig Dispense Refill  . aspirin 81 MG tablet Take 81 mg by mouth daily.    . cyanocobalamin 1000 MCG tablet Take 100 mcg by mouth daily.    Marland Kitchen levothyroxine (SYNTHROID, LEVOTHROID) 100 MCG tablet TAKE ONE TABLET BY MOUTH ONCE DAILY 90 tablet 0   No current facility-administered medications on file prior to visit.    No Known Allergies  Past Medical History  Diagnosis Date  . History of colon cancer   . Hyperlipidemia   . Thyroid disease   . BPH (benign prostatic hypertrophy)   . Diplopia 1986    concussion    Past Surgical History  Procedure Laterality Date  . Partial colectomy  03/1999    ([facility]) chemo with Choksi  . Turp vaporization  1993  . Hernia repair      No family history on file.  Social History   Social History  . Marital Status: Single    Spouse Name: N/A  . Number of Children: N/A  . Years of Education: N/A   Occupational History  . 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 Smoking status: Never Smoker   . Smokeless tobacco: Never Used  . Alcohol Use: Yes  . Drug Use: No  . Sexual Activity: Not on file   Other Topics Concern  . Not on file   Social History Narrative   Has living will    Requests niece Adam Bentley (or nephew Adam Bentley) as health care power of attorney   Has DNR already from me   No tube feeds if cognitively unaware (unless clearly seems reversible)   Review of Systems No headaches No vision change     Objective:    Physical Exam  Musculoskeletal:  Left 3rd finger is bent at PIP and swollen No tenderness Can passively extend to ~ 75 degrees  Skin:  Healing abrasion over left eye and left cheek          Assessment & Plan:

## 2016-04-04 NOTE — Assessment & Plan Note (Signed)
Discussed good judgement  Can't walk all the way to [facility] on his own Should stay on grounds at [place] and in stores, etc

## 2016-04-28 ENCOUNTER — Encounter: Payer: Self-pay | Admitting: Internal Medicine

## 2016-04-28 ENCOUNTER — Ambulatory Visit (INDEPENDENT_AMBULATORY_CARE_PROVIDER_SITE_OTHER): Payer: Medicare Other | Admitting: Internal Medicine

## 2016-04-28 VITALS — BP 138/84 | HR 75 | Temp 97.5°F | Ht 68.0 in | Wt 170.0 lb

## 2016-04-28 DIAGNOSIS — R269 Unspecified abnormalities of gait and mobility: Secondary | ICD-10-CM

## 2016-04-28 DIAGNOSIS — R260 Ataxic gait: Secondary | ICD-10-CM

## 2016-04-28 DIAGNOSIS — N400 Enlarged prostate without lower urinary tract symptoms: Secondary | ICD-10-CM

## 2016-04-28 DIAGNOSIS — E039 Hypothyroidism, unspecified: Secondary | ICD-10-CM

## 2016-04-28 DIAGNOSIS — Z7189 Other specified counseling: Secondary | ICD-10-CM

## 2016-04-28 DIAGNOSIS — Z0000 Encounter for general adult medical examination without abnormal findings: Secondary | ICD-10-CM | POA: Diagnosis not present

## 2016-04-28 LAB — CBC WITH DIFFERENTIAL/PLATELET
BASOS PCT: 0.3 % (ref 0.0–3.0)
Basophils Absolute: 0 10*3/uL (ref 0.0–0.1)
Eosinophils Absolute: 0.1 10*3/uL (ref 0.0–0.7)
Eosinophils Relative: 1.6 % (ref 0.0–5.0)
HCT: 43.2 % (ref 39.0–52.0)
HEMOGLOBIN: 14.9 g/dL (ref 13.0–17.0)
LYMPHS PCT: 17.7 % (ref 12.0–46.0)
Lymphs Abs: 1.1 10*3/uL (ref 0.7–4.0)
MCHC: 34.6 g/dL (ref 30.0–36.0)
MCV: 91.5 fl (ref 78.0–100.0)
Monocytes Absolute: 0.7 10*3/uL (ref 0.1–1.0)
Monocytes Relative: 12.1 % — ABNORMAL HIGH (ref 3.0–12.0)
NEUTROS ABS: 4.1 10*3/uL (ref 1.4–7.7)
Neutrophils Relative %: 68.3 % (ref 43.0–77.0)
Platelets: 233 10*3/uL (ref 150.0–400.0)
RBC: 4.72 Mil/uL (ref 4.22–5.81)
RDW: 14.1 % (ref 11.5–15.5)
WBC: 6 10*3/uL (ref 4.0–10.5)

## 2016-04-28 LAB — COMPREHENSIVE METABOLIC PANEL
ALT: 19 U/L (ref 0–53)
AST: 21 U/L (ref 0–37)
Albumin: 4.3 g/dL (ref 3.5–5.2)
Alkaline Phosphatase: 121 U/L — ABNORMAL HIGH (ref 39–117)
BUN: 13 mg/dL (ref 6–23)
CALCIUM: 10.8 mg/dL — AB (ref 8.4–10.5)
CHLORIDE: 102 meq/L (ref 96–112)
CO2: 30 meq/L (ref 19–32)
CREATININE: 0.9 mg/dL (ref 0.40–1.50)
GFR: 84.37 mL/min (ref >60.00)
Glucose, Bld: 105 mg/dL — ABNORMAL HIGH (ref 70–99)
POTASSIUM: 4.4 meq/L (ref 3.5–5.1)
Sodium: 138 mEq/L (ref 135–145)
Total Bilirubin: 1.2 mg/dL (ref 0.2–1.2)
Total Protein: 7.8 g/dL (ref 6.0–8.3)

## 2016-04-28 LAB — T4, FREE: FREE T4: 1.09 ng/dL (ref 0.60–1.60)

## 2016-04-28 LAB — TSH: TSH: 0.24 u[IU]/mL — ABNORMAL LOW (ref 0.35–4.50)

## 2016-04-28 NOTE — Assessment & Plan Note (Signed)
Seems euthyroid ?Will check labs ?

## 2016-04-28 NOTE — Assessment & Plan Note (Signed)
Urged him to use cane all the time

## 2016-04-28 NOTE — Progress Notes (Signed)
Subjective:    Patient ID: Adam Bentley, male    DOB: 05/10/1924, 82 y.o.   MRN: [id number]  HPI Here for Medicare wellness and follow up of chronic medical conditions Reviewed form and advanced directives Reviewed other doctors Variable alcohol--- 1-2 glasses of wine occasionally No tobacco Vision fine Uses hearing aides Only 1 fall--some injury No depression or anhedonia No worrisome memory problems Exercises regularly  Feels well Living in adult living community Main meal provided--fixes his other meals Gets laundry and weekly housekeeper Does bills, etc himself Not driving  No new concerns Energy levels are fine No change in skin, hair or nails  Walking regularly Usually uses cane--but not always-----discussed  Voids okay Nocturia x 1 No daytime urgency. Rare dribbling  Current Outpatient Prescriptions on File Prior to Visit  Medication Sig Dispense Refill  . aspirin 81 MG tablet Take 81 mg by mouth daily.    . cyanocobalamin 1000 MCG tablet Take 100 mcg by mouth daily.    Marland Kitchen levothyroxine (SYNTHROID, LEVOTHROID) 100 MCG tablet TAKE ONE TABLET BY MOUTH ONCE DAILY 90 tablet 0   No current facility-administered medications on file prior to visit.    No Known Allergies  Past Medical History  Diagnosis Date  . History of colon cancer   . Hyperlipidemia   . Thyroid disease   . BPH (benign prostatic hypertrophy)   . Diplopia 1986    concussion    Past Surgical History  Procedure Laterality Date  . Partial colectomy  03/1999    ([facility]) chemo with Choksi  . Turp vaporization  1993  . Hernia repair      No family history on file.  Social History   Social History  . Marital Status: Single    Spouse Name: N/A  . Number of Children: N/A  . Years of Education: N/A   Occupational History  . 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 Smoking status: Never Smoker   . Smokeless tobacco: Never Used  . Alcohol Use:  Yes  . Drug Use: No  . Sexual Activity: Not on file   Other Topics Concern  . Not on file   Social History Narrative   Has living will    Requests niece [name] (or nephew Delfino Lovett) as health care power of attorney   Has DNR already from me   No tube feeds if cognitively unaware (unless clearly seems reversible)   Review of Systems Wears seat belt Teeth are good---partial on bottom, bridge on top Appetite is good Weight is stable Bowels are fine. No blood  No suspicious skin lesions. Bruises from fall are healing up    Objective:   Physical Exam  Constitutional: He is oriented to person, place, and time. He appears well-developed and well-nourished. No distress.  HENT:  Mouth/Throat: Oropharynx is clear and moist. No oropharyngeal exudate.  Neck: Normal range of motion. Neck supple. No thyromegaly present.  Cardiovascular: Normal rate, regular rhythm, normal heart sounds and intact distal pulses.  Exam reveals no gallop.   No murmur heard. Pulmonary/Chest: Effort normal and breath sounds normal. No respiratory distress. He has no wheezes. He has no rales.  Abdominal: Soft. There is no tenderness.  Musculoskeletal: He exhibits no edema or tenderness.  Lymphadenopathy:    He has no cervical adenopathy.  Neurological: He is alert and oriented to person, place, and time.  President-- "Trump, Bush-----(then) Obama" [id number] D-l-o-w Recall 1/3  Skin: No rash noted. No  erythema.  Psychiatric: He has a normal mood and affect. His behavior is normal.          Assessment & Plan:

## 2016-04-28 NOTE — Assessment & Plan Note (Signed)
Voids okay without meds 

## 2016-04-28 NOTE — Assessment & Plan Note (Signed)
Has DNR 

## 2016-04-28 NOTE — Assessment & Plan Note (Signed)
I have personally reviewed the Medicare Annual Wellness questionnaire and have noted 1. The patient's medical and social history 2. Their use of alcohol, tobacco or illicit drugs 3. Their current medications and supplements 4. The patient's functional ability including ADL's, fall risks, home safety risks and hearing or visual             impairment. 5. Diet and physical activities 6. Evidence for depression or mood disorders  The patients weight, height, BMI and visual acuity have been recorded in the chart I have made referrals, counseling and provided education to the patient based review of the above and I have provided the pt with a written personalized care plan for preventive services.  I have provided you with a copy of your personalized plan for preventive services. Please take the time to review along with your updated medication list.  No cancer screening due to age Had zostavax--we will check with pharmacy Exercises regularly

## 2016-04-28 NOTE — Progress Notes (Signed)
Pre visit review using our clinic review tool, if applicable. No additional management support is needed unless otherwise documented below in the visit note. 

## 2016-06-24 ENCOUNTER — Other Ambulatory Visit: Payer: Self-pay | Admitting: Internal Medicine

## 2016-07-24 DIAGNOSIS — H2513 Age-related nuclear cataract, bilateral: Secondary | ICD-10-CM | POA: Diagnosis not present

## 2016-08-31 DIAGNOSIS — H00012 Hordeolum externum right lower eyelid: Secondary | ICD-10-CM | POA: Diagnosis not present

## 2016-10-13 ENCOUNTER — Ambulatory Visit (INDEPENDENT_AMBULATORY_CARE_PROVIDER_SITE_OTHER): Payer: Medicare Other

## 2016-10-13 DIAGNOSIS — Z23 Encounter for immunization: Secondary | ICD-10-CM | POA: Diagnosis not present

## 2017-05-01 ENCOUNTER — Ambulatory Visit (INDEPENDENT_AMBULATORY_CARE_PROVIDER_SITE_OTHER): Payer: Medicare Other | Admitting: Internal Medicine

## 2017-05-01 ENCOUNTER — Encounter: Payer: Self-pay | Admitting: Internal Medicine

## 2017-05-01 VITALS — BP 140/84 | HR 58 | Temp 97.3°F | Ht 67.5 in | Wt 168.0 lb

## 2017-05-01 DIAGNOSIS — E039 Hypothyroidism, unspecified: Secondary | ICD-10-CM

## 2017-05-01 DIAGNOSIS — Z7189 Other specified counseling: Secondary | ICD-10-CM

## 2017-05-01 DIAGNOSIS — Z0000 Encounter for general adult medical examination without abnormal findings: Secondary | ICD-10-CM

## 2017-05-01 DIAGNOSIS — N400 Enlarged prostate without lower urinary tract symptoms: Secondary | ICD-10-CM

## 2017-05-01 DIAGNOSIS — R260 Ataxic gait: Secondary | ICD-10-CM

## 2017-05-01 LAB — COMPREHENSIVE METABOLIC PANEL
ALBUMIN: 4.3 g/dL (ref 3.5–5.2)
ALT: 19 U/L (ref 0–53)
AST: 23 U/L (ref 0–37)
Alkaline Phosphatase: 112 U/L (ref 39–117)
BILIRUBIN TOTAL: 0.9 mg/dL (ref 0.2–1.2)
BUN: 14 mg/dL (ref 6–23)
CALCIUM: 10.6 mg/dL — AB (ref 8.4–10.5)
CO2: 32 mEq/L (ref 19–32)
CREATININE: 0.88 mg/dL (ref 0.40–1.50)
Chloride: 103 mEq/L (ref 96–112)
GFR: 86.39 mL/min (ref >60.00)
Glucose, Bld: 103 mg/dL — ABNORMAL HIGH (ref 70–99)
Potassium: 4.9 mEq/L (ref 3.5–5.1)
Sodium: 137 mEq/L (ref 135–145)
TOTAL PROTEIN: 7.6 g/dL (ref 6.0–8.3)

## 2017-05-01 LAB — CBC WITH DIFFERENTIAL/PLATELET
BASOS ABS: 0 10*3/uL (ref 0.0–0.1)
Basophils Relative: 0.3 % (ref 0.0–3.0)
EOS ABS: 0.2 10*3/uL (ref 0.0–0.7)
Eosinophils Relative: 2.7 % (ref 0.0–5.0)
HEMATOCRIT: 41.5 % (ref 39.0–52.0)
HEMOGLOBIN: 14.3 g/dL (ref 13.0–17.0)
LYMPHS PCT: 17.3 % (ref 12.0–46.0)
Lymphs Abs: 1.1 10*3/uL (ref 0.7–4.0)
MCHC: 34.4 g/dL (ref 30.0–36.0)
MCV: 93.7 fl (ref 78.0–100.0)
MONO ABS: 0.7 10*3/uL (ref 0.1–1.0)
Monocytes Relative: 11.3 % (ref 3.0–12.0)
Neutro Abs: 4.1 10*3/uL (ref 1.4–7.7)
Neutrophils Relative %: 68.4 % (ref 43.0–77.0)
PLATELETS: 220 10*3/uL (ref 150.0–400.0)
RBC: 4.42 Mil/uL (ref 4.22–5.81)
RDW: 13.8 % (ref 11.5–15.5)
WBC: 6.1 10*3/uL (ref 4.0–10.5)

## 2017-05-01 LAB — TSH: TSH: 0.61 u[IU]/mL (ref 0.35–4.50)

## 2017-05-01 LAB — T4, FREE: FREE T4: 0.93 ng/dL (ref 0.60–1.60)

## 2017-05-01 NOTE — Assessment & Plan Note (Signed)
Seems euthyroid ?Will check labs ?

## 2017-05-01 NOTE — Progress Notes (Signed)
Subjective:    Patient ID: Adam Bentley, male    DOB: Mar 08, 1924, 83 y.o.   MRN: [id number]  HPI Here for Medicare wellness and follow up of chronic health conditions Reviewed form and advanced directives Reviewed other doctors 1-2 glasses of wine still No tobacco Walks regularly Recent fall in [place]-- no injury. Also fell against wall and had some scrapes on right arm. No depression or anhedonia Vision is fair--needs follow up (worsening cataract on left eye) Hearing aides---does okay with them No sig problems with memory  Still not driving but he misses it Main meal from [facility]--- fixes the other meals Housekeeper once a week He does laundry and other household tasks  Walks with cane Uses the rollator sometimes Walks dog without cane though  Voids okay Nocturia stable at 2 per night Flow okay--no incontinence  Energy is fine Weight stable No change in hair, skin or nails  Current Outpatient Prescriptions on File Prior to Visit  Medication Sig Dispense Refill  . aspirin 81 MG tablet Take 81 mg by mouth daily.    . cyanocobalamin 1000 MCG tablet Take 100 mcg by mouth daily.    Marland Kitchen levothyroxine (SYNTHROID, LEVOTHROID) 100 MCG tablet TAKE ONE TABLET BY MOUTH ONCE DAILY 90 tablet 3   No current facility-administered medications on file prior to visit.     No Known Allergies  Past Medical History:  Diagnosis Date  . BPH (benign prostatic hypertrophy)   . Diplopia 1986   concussion  . History of colon cancer   . Hyperlipidemia   . Thyroid disease     Past Surgical History:  Procedure Laterality Date  . HERNIA REPAIR    . PARTIAL COLECTOMY  03/1999   ([facility]) chemo with Choksi  . TURP VAPORIZATION  1993    No family history on file.  Social History   Social History  . Marital status: Single    Spouse name: N/A  . Number of children: N/A  . Years of education: N/A   Occupational History  . retired, Sales executive in Engineer, production Retired    Social History Main Topics  . Smoking status: Never Smoker  . Smokeless tobacco: Never Used  . Alcohol use Yes  . Drug use: No  . Sexual activity: Not on file   Other Topics Concern  . Not on file   Social History Narrative   Has living will    Requests niece Adam Bentley (or nephew Adam Bentley) as health care power of attorney   Has DNR already from me   No tube feeds if cognitively unaware (unless clearly seems reversible)   Review of Systems Appetite is good Sleeps well Wears seat belt New full lower denture--bridge on top. Dentist name? Bowels are fine. No blood No significant back or joint pain. Did use Vick's on left neck when stiff after sleeping wrong Easy bruising No rash or suspicious skin lesion No chest pain or SOB Occasional dizziness--if first getting up. Takes his time. No syncope No edema    Objective:   Physical Exam  Constitutional: He is oriented to person, place, and time. He appears well-developed. No distress.  HENT:  Mouth/Throat: Oropharynx is clear and moist. No oropharyngeal exudate.  Neck: No thyromegaly present.  Cardiovascular: Normal rate, regular rhythm, normal heart sounds and intact distal pulses.  Exam reveals no gallop.   No murmur heard. Pulmonary/Chest: Effort normal and breath sounds normal. No respiratory distress. He has no wheezes. He has no rales.  Abdominal: Soft.  There is no tenderness.  Musculoskeletal: He exhibits no edema or tenderness.  Lymphadenopathy:    He has no cervical adenopathy.  Neurological: He is alert and oriented to person, place, and time.  President-- "Dwaine Deter, Bush" [phone] D-l-o-w Recall 3/3  Skin: No rash noted. No erythema.  Psychiatric: He has a normal mood and affect. His behavior is normal.          Assessment & Plan:

## 2017-05-01 NOTE — Progress Notes (Signed)
Pre visit review using our clinic review tool, if applicable. No additional management support is needed unless otherwise documented below in the visit note. 

## 2017-05-01 NOTE — Assessment & Plan Note (Signed)
I have personally reviewed the Medicare Annual Wellness questionnaire and have noted 1. The patient's medical and social history 2. Their use of alcohol, tobacco or illicit drugs 3. Their current medications and supplements 4. The patient's functional ability including ADL's, fall risks, home safety risks and hearing or visual             impairment. 5. Diet and physical activities 6. Evidence for depression or mood disorders  The patients weight, height, BMI and visual acuity have been recorded in the chart I have made referrals, counseling and provided education to the patient based review of the above and I have provided the pt with a written personalized care plan for preventive services.  I have provided you with a copy of your personalized plan for preventive services. Please take the time to review along with your updated medication list.  No cancer screening due to age Up to date on vaccines Yearly flu vaccine Stays active

## 2017-05-01 NOTE — Assessment & Plan Note (Signed)
Does okay Has fallen though counseled

## 2017-05-01 NOTE — Assessment & Plan Note (Signed)
Has DNR 

## 2017-05-01 NOTE — Assessment & Plan Note (Signed)
Very mild symptoms only No meds needed

## 2017-05-02 ENCOUNTER — Encounter: Payer: Self-pay | Admitting: *Deleted

## 2017-07-12 ENCOUNTER — Other Ambulatory Visit: Payer: Self-pay | Admitting: Internal Medicine

## 2017-08-06 DIAGNOSIS — H2513 Age-related nuclear cataract, bilateral: Secondary | ICD-10-CM | POA: Diagnosis not present

## 2017-08-13 DIAGNOSIS — H2512 Age-related nuclear cataract, left eye: Secondary | ICD-10-CM | POA: Diagnosis not present

## 2017-08-14 ENCOUNTER — Encounter: Payer: Self-pay | Admitting: *Deleted

## 2017-08-20 NOTE — Discharge Instructions (Signed)

## 2017-08-21 ENCOUNTER — Encounter
Admission: RE | Disposition: A | Discharge status: Home / Self Care | Payer: Self-pay | Source: Ambulatory Visit | Attending: Ophthalmology

## 2017-08-21 ENCOUNTER — Ambulatory Visit: Payer: Medicare Other | Admitting: Anesthesiology

## 2017-08-21 ENCOUNTER — Ambulatory Visit
Admission: RE | Admit: 2017-08-21 | Discharge: 2017-08-21 | Disposition: A | Discharge status: Home / Self Care | Payer: Medicare Other | Source: Ambulatory Visit | Attending: Ophthalmology | Admitting: Ophthalmology

## 2017-08-21 DIAGNOSIS — H2512 Age-related nuclear cataract, left eye: Secondary | ICD-10-CM | POA: Insufficient documentation

## 2017-08-21 DIAGNOSIS — E039 Hypothyroidism, unspecified: Secondary | ICD-10-CM | POA: Diagnosis not present

## 2017-08-21 DIAGNOSIS — Z85038 Personal history of other malignant neoplasm of large intestine: Secondary | ICD-10-CM | POA: Diagnosis not present

## 2017-08-21 DIAGNOSIS — Z9221 Personal history of antineoplastic chemotherapy: Secondary | ICD-10-CM | POA: Insufficient documentation

## 2017-08-21 DIAGNOSIS — H2513 Age-related nuclear cataract, bilateral: Secondary | ICD-10-CM | POA: Diagnosis not present

## 2017-08-21 HISTORY — DX: Hypothyroidism, unspecified: E03.9

## 2017-08-21 HISTORY — DX: Presence of dental prosthetic device (complete) (partial): Z97.2

## 2017-08-21 HISTORY — PX: CATARACT EXTRACTION W/PHACO: SHX586

## 2017-08-21 HISTORY — DX: Presence of external hearing-aid: Z97.4

## 2017-08-21 SURGERY — PHACOEMULSIFICATION, CATARACT, WITH IOL INSERTION
Anesthesia: Monitor Anesthesia Care | Laterality: Left | Wound class: Clean

## 2017-08-21 MED ORDER — ARMC OPHTHALMIC DILATING DROPS
1.0000 "application " | OPHTHALMIC | Status: DC | PRN
  Administered 2017-08-21 (×3): 1 via OPHTHALMIC

## 2017-08-21 MED ORDER — MOXIFLOXACIN HCL 0.5 % OP SOLN
OPHTHALMIC | Status: DC | PRN
  Administered 2017-08-21: 0.2 mL via OPHTHALMIC

## 2017-08-21 MED ORDER — EPINEPHRINE PF 1 MG/ML IJ SOLN
INTRAOCULAR | Status: DC | PRN
  Administered 2017-08-21: 94 mL via OPHTHALMIC

## 2017-08-21 MED ORDER — SODIUM HYALURONATE 23 MG/ML IO SOLN
INTRAOCULAR | Status: DC | PRN
  Administered 2017-08-21: 0.6 mL via INTRAOCULAR

## 2017-08-21 MED ORDER — MIDAZOLAM HCL 2 MG/2ML IJ SOLN
INTRAMUSCULAR | Status: DC | PRN
  Administered 2017-08-21: 1 mg via INTRAVENOUS

## 2017-08-21 MED ORDER — SODIUM HYALURONATE 10 MG/ML IO SOLN
INTRAOCULAR | Status: DC | PRN
  Administered 2017-08-21: 0.55 mL via INTRAOCULAR

## 2017-08-21 MED ORDER — FENTANYL CITRATE (PF) 100 MCG/2ML IJ SOLN
INTRAMUSCULAR | Status: DC | PRN
  Administered 2017-08-21: 50 ug via INTRAVENOUS

## 2017-08-21 MED ORDER — LIDOCAINE HCL (PF) 2 % IJ SOLN
INTRAMUSCULAR | Status: DC | PRN
  Administered 2017-08-21: 1 mL via INTRAOCULAR

## 2017-08-21 MED ORDER — LACTATED RINGERS IV SOLN: INTRAVENOUS | Status: DC

## 2017-08-21 SURGICAL SUPPLY — 17 items
CANNULA ANT/CHMB 27G (MISCELLANEOUS) ×1 IMPLANT
CANNULA ANT/CHMB 27GA (MISCELLANEOUS) ×3 IMPLANT
DISSECTOR HYDRO NUCLEUS 50X22 (MISCELLANEOUS) ×3 IMPLANT
GLOVE BIO SURGEON STRL SZ8 (GLOVE) ×3 IMPLANT
GLOVE SURG LX 7.5 STRW (GLOVE) ×2
GLOVE SURG LX STRL 7.5 STRW (GLOVE) ×1 IMPLANT
GOWN STRL REUS W/ TWL LRG LVL3 (GOWN DISPOSABLE) ×2 IMPLANT
GOWN STRL REUS W/TWL LRG LVL3 (GOWN DISPOSABLE) ×6
LENS IOL TECNIS ITEC 21.0 (Intraocular Lens) ×2 IMPLANT
MARKER SKIN DUAL TIP RULER LAB (MISCELLANEOUS) ×3 IMPLANT
PACK CATARACT (MISCELLANEOUS) ×3 IMPLANT
PACK DR. KING ARMS (PACKS) ×3 IMPLANT
PACK EYE AFTER SURG (MISCELLANEOUS) ×3 IMPLANT
SYR 3ML LL SCALE MARK (SYRINGE) ×3 IMPLANT
SYR TB 1ML LUER SLIP (SYRINGE) ×3 IMPLANT
WATER STERILE IRR 500ML POUR (IV SOLUTION) ×3 IMPLANT
WIPE NON LINTING 3.25X3.25 (MISCELLANEOUS) ×3 IMPLANT

## 2017-08-21 NOTE — H&P (Signed)
The History and Physical notes are on paper, have been signed, and are to be scanned.   I have examined the patient and there are no changes to the H&P.   Benay Pillow 08/21/2017 11:12 AM

## 2017-08-21 NOTE — Op Note (Signed)
OPERATIVE NOTE  Adam Bentley [id number] 08/21/2017   PREOPERATIVE DIAGNOSIS:  Nuclear sclerotic cataract left eye.  H25.12   POSTOPERATIVE DIAGNOSIS:    Nuclear sclerotic cataract left eye.     PROCEDURE:  Phacoemusification with posterior chamber intraocular lens placement of the left eye   LENS:   Implant Name Type Inv. Item Serial No. Manufacturer Lot No. LRB No. Used  LENS IOL DIOP 21.0 - [id number] Intraocular Lens LENS IOL DIOP 21.0 [id number] AMO   Left 1       PCB00 +21.0   ULTRASOUND TIME: 0 minutes 39 seconds.  CDE 7.95   SURGEON:  Benay Pillow, MD, MPH   ANESTHESIA:  Topical with tetracaine drops augmented with 1% preservative-free intracameral lidocaine.  ESTIMATED BLOOD LOSS: <1 mL   COMPLICATIONS:  None.   DESCRIPTION OF PROCEDURE:  The patient was identified in the holding room and transported to the operating room and placed in the supine position under the operating microscope.  The left eye was identified as the operative eye and it was prepped and draped in the usual sterile ophthalmic fashion.   A 1.0 millimeter clear-corneal paracentesis was made at the 5:00 position. 0.5 ml of preservative-free 1% lidocaine with epinephrine was injected into the anterior chamber.  The anterior chamber was filled with Healon 5 viscoelastic.  A 2.4 millimeter keratome was used to make a near-clear corneal incision at the 2:00 position.  A curvilinear capsulorrhexis was made with a cystotome and capsulorrhexis forceps.  Balanced salt solution was used to hydrodissect and hydrodelineate the nucleus.   Phacoemulsification was then used in stop and chop fashion to remove the lens nucleus and epinucleus.  The remaining cortex was then removed using the irrigation and aspiration handpiece. Healon was then placed into the capsular bag to distend it for lens placement.  A lens was then injected into the capsular bag.  The remaining viscoelastic was aspirated.   Wounds were  hydrated with balanced salt solution.  The anterior chamber was inflated to a physiologic pressure with balanced salt solution.  Intracameral vigamox 0.1 mL undiltued was injected into the eye and a drop placed onto the ocular surface.  No wound leaks were noted.  The patient was taken to the recovery room in stable condition without complications of anesthesia or surgery  Benay Pillow 08/21/2017, 11:49 AM

## 2017-08-21 NOTE — Anesthesia Preprocedure Evaluation (Signed)
Anesthesia Evaluation  Patient identified by MRN, date of birth, ID band Patient awake    Reviewed: Allergy & Precautions, H&P , NPO status , Patient's Chart, lab work & pertinent test results  Airway Mallampati: II  TM Distance: >3 FB Neck ROM: full    Dental no notable dental hx. (+) Implants, Chipped   Pulmonary    Pulmonary exam normal breath sounds clear to auscultation       Cardiovascular Normal cardiovascular exam Rhythm:regular Rate:Normal     Neuro/Psych    GI/Hepatic   Endo/Other  Hypothyroidism   Renal/GU      Musculoskeletal   Abdominal   Peds  Hematology   Anesthesia Other Findings   Reproductive/Obstetrics                             Anesthesia Physical Anesthesia Plan  ASA: II  Anesthesia Plan: MAC   Post-op Pain Management:    Induction:   PONV Risk Score and Plan: 1 and Midazolam  Airway Management Planned:   Additional Equipment:   Intra-op Plan:   Post-operative Plan:   Informed Consent: I have reviewed the patients History and Physical, chart, labs and discussed the procedure including the risks, benefits and alternatives for the proposed anesthesia with the patient or authorized representative who has indicated his/her understanding and acceptance.     Plan Discussed with: CRNA  Anesthesia Plan Comments:         Anesthesia Quick Evaluation

## 2017-08-21 NOTE — Transfer of Care (Signed)
Immediate Anesthesia Transfer of Care Note  Patient: Adam Bentley. Strehlow  Procedure(s) Performed: Procedure(s): CATARACT EXTRACTION PHACO AND INTRAOCULAR LENS PLACEMENT (IOC) LEFT (Left)  Patient Location: PACU  Anesthesia Type: MAC  Level of Consciousness: awake, alert  and patient cooperative  Airway and Oxygen Therapy: Patient Spontanous Breathing and Patient connected to supplemental oxygen  Post-op Assessment: Post-op Vital signs reviewed, Patient's Cardiovascular Status Stable, Respiratory Function Stable, Patent Airway and No signs of Nausea or vomiting  Post-op Vital Signs: Reviewed and stable  Complications: No apparent anesthesia complications

## 2017-08-21 NOTE — Anesthesia Postprocedure Evaluation (Signed)
Anesthesia Post Note  Patient: Adam Bentley. Hlad  Procedure(s) Performed: Procedure(s) (LRB): CATARACT EXTRACTION PHACO AND INTRAOCULAR LENS PLACEMENT (IOC) LEFT (Left)  Patient location during evaluation: PACU Anesthesia Type: MAC Level of consciousness: awake and alert and oriented Pain management: satisfactory to patient Vital Signs Assessment: post-procedure vital signs reviewed and stable Respiratory status: spontaneous breathing, nonlabored ventilation and respiratory function stable Cardiovascular status: blood pressure returned to baseline and stable Postop Assessment: Adequate PO intake and No signs of nausea or vomiting Anesthetic complications: no    Raliegh Ip

## 2017-08-21 NOTE — Anesthesia Procedure Notes (Signed)
Procedure Name: MAC Performed by: Letha Mirabal Pre-anesthesia Checklist: Patient identified, Emergency Drugs available, Suction available, Timeout performed and Patient being monitored Patient Re-evaluated:Patient Re-evaluated prior to inductionOxygen Delivery Method: Nasal cannula Placement Confirmation: positive ETCO2     

## 2017-08-22 ENCOUNTER — Encounter: Payer: Self-pay | Admitting: Ophthalmology

## 2017-08-30 DIAGNOSIS — H2511 Age-related nuclear cataract, right eye: Secondary | ICD-10-CM | POA: Diagnosis not present

## 2017-09-04 ENCOUNTER — Encounter: Payer: Self-pay | Admitting: *Deleted

## 2017-09-05 MED ORDER — ACETAMINOPHEN 160 MG/5ML PO SOLN: 325.0000 mg | Freq: Once | ORAL | Status: DC

## 2017-09-05 MED ORDER — ACETAMINOPHEN 325 MG PO TABS: 325.0000 mg | ORAL_TABLET | Freq: Once | ORAL | Status: DC

## 2017-09-05 NOTE — Discharge Instructions (Signed)

## 2017-09-10 ENCOUNTER — Encounter
Admission: RE | Disposition: A | Discharge status: Home / Self Care | Payer: Self-pay | Source: Ambulatory Visit | Attending: Ophthalmology

## 2017-09-10 ENCOUNTER — Ambulatory Visit: Payer: Medicare Other | Admitting: Anesthesiology

## 2017-09-10 ENCOUNTER — Ambulatory Visit
Admission: RE | Admit: 2017-09-10 | Discharge: 2017-09-10 | Disposition: A | Discharge status: Home / Self Care | Payer: Medicare Other | Source: Ambulatory Visit | Attending: Ophthalmology | Admitting: Ophthalmology

## 2017-09-10 DIAGNOSIS — H2511 Age-related nuclear cataract, right eye: Secondary | ICD-10-CM | POA: Insufficient documentation

## 2017-09-10 DIAGNOSIS — Z85038 Personal history of other malignant neoplasm of large intestine: Secondary | ICD-10-CM | POA: Insufficient documentation

## 2017-09-10 DIAGNOSIS — Z9221 Personal history of antineoplastic chemotherapy: Secondary | ICD-10-CM | POA: Insufficient documentation

## 2017-09-10 DIAGNOSIS — N400 Enlarged prostate without lower urinary tract symptoms: Secondary | ICD-10-CM | POA: Insufficient documentation

## 2017-09-10 DIAGNOSIS — E039 Hypothyroidism, unspecified: Secondary | ICD-10-CM | POA: Insufficient documentation

## 2017-09-10 DIAGNOSIS — Z9842 Cataract extraction status, left eye: Secondary | ICD-10-CM | POA: Insufficient documentation

## 2017-09-10 DIAGNOSIS — Z9049 Acquired absence of other specified parts of digestive tract: Secondary | ICD-10-CM | POA: Diagnosis not present

## 2017-09-10 DIAGNOSIS — Z87891 Personal history of nicotine dependence: Secondary | ICD-10-CM | POA: Diagnosis not present

## 2017-09-10 HISTORY — PX: CATARACT EXTRACTION W/PHACO: SHX586

## 2017-09-10 SURGERY — PHACOEMULSIFICATION, CATARACT, WITH IOL INSERTION
Anesthesia: Monitor Anesthesia Care | Laterality: Right | Wound class: Clean

## 2017-09-10 MED ORDER — ARMC OPHTHALMIC DILATING DROPS
1.0000 "application " | OPHTHALMIC | Status: DC | PRN
  Administered 2017-09-10 (×3): 1 via OPHTHALMIC

## 2017-09-10 MED ORDER — BSS IO SOLN
INTRAOCULAR | Status: DC | PRN
  Administered 2017-09-10: 146 mL via OPHTHALMIC

## 2017-09-10 MED ORDER — ACETAMINOPHEN 160 MG/5ML PO SOLN: 325.0000 mg | ORAL | Status: DC | PRN

## 2017-09-10 MED ORDER — ONDANSETRON HCL 4 MG/2ML IJ SOLN: 4.0000 mg | Freq: Once | INTRAMUSCULAR | Status: DC | PRN

## 2017-09-10 MED ORDER — LACTATED RINGERS IV SOLN: 10.0000 mL/h | INTRAVENOUS | Status: DC

## 2017-09-10 MED ORDER — SODIUM HYALURONATE 10 MG/ML IO SOLN
INTRAOCULAR | Status: DC | PRN
  Administered 2017-09-10: 0.55 mL via INTRAOCULAR

## 2017-09-10 MED ORDER — ACETAMINOPHEN 325 MG PO TABS: 325.0000 mg | ORAL_TABLET | ORAL | Status: DC | PRN

## 2017-09-10 MED ORDER — LIDOCAINE HCL (PF) 4 % IJ SOLN
INTRAMUSCULAR | Status: DC | PRN
  Administered 2017-09-10: 1 mL via OPHTHALMIC

## 2017-09-10 MED ORDER — MIDAZOLAM HCL 2 MG/2ML IJ SOLN
INTRAMUSCULAR | Status: DC | PRN
  Administered 2017-09-10: 2 mg via INTRAVENOUS

## 2017-09-10 MED ORDER — MOXIFLOXACIN HCL 0.5 % OP SOLN
OPHTHALMIC | Status: DC | PRN
  Administered 2017-09-10: 0.2 mL via OPHTHALMIC

## 2017-09-10 MED ORDER — SODIUM HYALURONATE 23 MG/ML IO SOLN
INTRAOCULAR | Status: DC | PRN
  Administered 2017-09-10: 0.6 mL via INTRAOCULAR

## 2017-09-10 MED ORDER — FENTANYL CITRATE (PF) 100 MCG/2ML IJ SOLN
INTRAMUSCULAR | Status: DC | PRN
  Administered 2017-09-10: 50 ug via INTRAVENOUS

## 2017-09-10 SURGICAL SUPPLY — 17 items
CANNULA ANT/CHMB 27G (MISCELLANEOUS) ×1 IMPLANT
CANNULA ANT/CHMB 27GA (MISCELLANEOUS) ×3 IMPLANT
DISSECTOR HYDRO NUCLEUS 50X22 (MISCELLANEOUS) ×3 IMPLANT
GLOVE BIO SURGEON STRL SZ8 (GLOVE) ×3 IMPLANT
GLOVE SURG LX 7.5 STRW (GLOVE) ×2
GLOVE SURG LX STRL 7.5 STRW (GLOVE) ×1 IMPLANT
GOWN STRL REUS W/ TWL LRG LVL3 (GOWN DISPOSABLE) ×2 IMPLANT
GOWN STRL REUS W/TWL LRG LVL3 (GOWN DISPOSABLE) ×6
LENS IOL TECNIS ITEC 20.5 (Intraocular Lens) ×2 IMPLANT
MARKER SKIN DUAL TIP RULER LAB (MISCELLANEOUS) ×3 IMPLANT
PACK CATARACT (MISCELLANEOUS) ×3 IMPLANT
PACK DR. KING ARMS (PACKS) ×3 IMPLANT
PACK EYE AFTER SURG (MISCELLANEOUS) ×3 IMPLANT
SYR 3ML LL SCALE MARK (SYRINGE) ×3 IMPLANT
SYR TB 1ML LUER SLIP (SYRINGE) ×3 IMPLANT
WATER STERILE IRR 500ML POUR (IV SOLUTION) ×3 IMPLANT
WIPE NON LINTING 3.25X3.25 (MISCELLANEOUS) ×3 IMPLANT

## 2017-09-10 NOTE — Op Note (Signed)
OPERATIVE NOTE  Samul L. Leighty [id number] 09/10/2017   PREOPERATIVE DIAGNOSIS:  Nuclear sclerotic cataract right eye.  H25.11   POSTOPERATIVE DIAGNOSIS:    Nuclear sclerotic cataract right eye.     PROCEDURE:  Phacoemusification with posterior chamber intraocular lens placement of the right eye   LENS:   Implant Name Type Inv. Item Serial No. Manufacturer Lot No. LRB No. Used  LENS IOL DIOP 20.5 - [id number] Intraocular Lens LENS IOL DIOP 20.5 [id number] AMO   Right 1       PCB00 +20.5   ULTRASOUND TIME: 0 minutes 49 seconds.  CDE 6.78   SURGEON:  Benay Pillow, MD, MPH  ANESTHESIOLOGIST: Anesthesiologist: Fidel Levy, MD CRNA: Londell Moh, CRNA   ANESTHESIA:  Topical with tetracaine drops augmented with 1% preservative-free intracameral lidocaine.  ESTIMATED BLOOD LOSS: less than 1 mL.   COMPLICATIONS:  None.   DESCRIPTION OF PROCEDURE:  The patient was identified in the holding room and transported to the operating room and placed in the supine position under the operating microscope.  The right eye was identified as the operative eye and it was prepped and draped in the usual sterile ophthalmic fashion.   A 1.0 millimeter clear-corneal paracentesis was made at the 10:30 position. 0.5 ml of preservative-free 1% lidocaine with epinephrine was injected into the anterior chamber.  The anterior chamber was filled with Healon 5 viscoelastic.  A 2.4 millimeter keratome was used to make a near-clear corneal incision at the 8:00 position.  A curvilinear capsulorrhexis was made with a cystotome and capsulorrhexis forceps.  Balanced salt solution was used to hydrodissect and hydrodelineate the nucleus.   Phacoemulsification was then used in stop and chop fashion to remove the lens nucleus and epinucleus.  The remaining cortex was then removed using the irrigation and aspiration handpiece. Healon was then placed into the capsular bag to distend it for lens placement.  A lens was  then injected into the capsular bag.  The remaining viscoelastic was aspirated.   Wounds were hydrated with balanced salt solution.  The anterior chamber was inflated to a physiologic pressure with balanced salt solution.   Intracameral vigamox 0.1 mL undiluted was injected into the eye and a drop placed onto the ocular surface.  No wound leaks were noted.  The patient was taken to the recovery room in stable condition without complications of anesthesia or surgery  Benay Pillow 09/10/2017, 9:19 AM

## 2017-09-10 NOTE — Anesthesia Procedure Notes (Signed)
Procedure Name: MAC Performed by: Karry Causer Pre-anesthesia Checklist: Patient identified, Emergency Drugs available, Suction available, Timeout performed and Patient being monitored Patient Re-evaluated:Patient Re-evaluated prior to inductionOxygen Delivery Method: Nasal cannula Placement Confirmation: positive ETCO2     

## 2017-09-10 NOTE — H&P (Signed)
The History and Physical notes are on paper, have been signed, and are to be scanned.   I have examined the patient and there are no changes to the H&P.   Benay Pillow 09/10/2017 8:36 AM

## 2017-09-10 NOTE — Anesthesia Preprocedure Evaluation (Signed)
Anesthesia Evaluation  Patient identified by MRN, date of birth, ID band Patient awake    Reviewed: Allergy & Precautions, H&P , NPO status , Patient's Chart, lab work & pertinent test results  History of Anesthesia Complications Negative for: history of anesthetic complications  Airway Mallampati: II  TM Distance: >3 FB Neck ROM: full    Dental  (+) Partial Lower, Partial Upper   Pulmonary neg pulmonary ROS,    Pulmonary exam normal breath sounds clear to auscultation       Cardiovascular Exercise Tolerance: Good negative cardio ROS Normal cardiovascular exam Rhythm:regular Rate:Normal     Neuro/Psych negative neurological ROS  negative psych ROS   GI/Hepatic negative GI ROS, Neg liver ROS,   Endo/Other  Hypothyroidism   Renal/GU negative Renal ROS  negative genitourinary   Musculoskeletal   Abdominal   Peds  Hematology Colon cancer > colectomy   Anesthesia Other Findings   Reproductive/Obstetrics                             Anesthesia Physical Anesthesia Plan  ASA: II  Anesthesia Plan: MAC   Post-op Pain Management:    Induction:   PONV Risk Score and Plan:   Airway Management Planned:   Additional Equipment:   Intra-op Plan:   Post-operative Plan:   Informed Consent: I have reviewed the patients History and Physical, chart, labs and discussed the procedure including the risks, benefits and alternatives for the proposed anesthesia with the patient or authorized representative who has indicated his/her understanding and acceptance.     Plan Discussed with: CRNA  Anesthesia Plan Comments:         Anesthesia Quick Evaluation

## 2017-09-10 NOTE — Transfer of Care (Signed)
Immediate Anesthesia Transfer of Care Note  Patient: Adam Bentley. [name]  Procedure(s) Performed: Procedure(s): CATARACT EXTRACTION PHACO AND INTRAOCULAR LENS PLACEMENT (IOC) RIGHT (Right)  Patient Location: PACU  Anesthesia Type: MAC  Level of Consciousness: awake, alert  and patient cooperative  Airway and Oxygen Therapy: Patient Spontanous Breathing and Patient connected to supplemental oxygen  Post-op Assessment: Post-op Vital signs reviewed, Patient's Cardiovascular Status Stable, Respiratory Function Stable, Patent Airway and No signs of Nausea or vomiting  Post-op Vital Signs: Reviewed and stable  Complications: No apparent anesthesia complications

## 2017-09-10 NOTE — Anesthesia Postprocedure Evaluation (Signed)
Anesthesia Post Note  Patient: Adam Bentley. Aikens  Procedure(s) Performed: Procedure(s) (LRB): CATARACT EXTRACTION PHACO AND INTRAOCULAR LENS PLACEMENT ([facility]) RIGHT (Right)  Patient location during evaluation: PACU Anesthesia Type: MAC Level of consciousness: awake and alert Pain management: pain level controlled Vital Signs Assessment: post-procedure vital signs reviewed and stable Respiratory status: spontaneous breathing, nonlabored ventilation, respiratory function stable and patient connected to nasal cannula oxygen Cardiovascular status: stable and blood pressure returned to baseline Postop Assessment: no apparent nausea or vomiting Anesthetic complications: no    [name]

## 2017-09-11 ENCOUNTER — Encounter: Payer: Self-pay | Admitting: Ophthalmology

## 2017-10-12 ENCOUNTER — Ambulatory Visit (INDEPENDENT_AMBULATORY_CARE_PROVIDER_SITE_OTHER): Payer: Medicare Other

## 2017-10-12 DIAGNOSIS — Z23 Encounter for immunization: Secondary | ICD-10-CM | POA: Diagnosis not present

## 2018-05-03 ENCOUNTER — Encounter: Payer: Medicare Other | Admitting: Internal Medicine

## 2018-05-22 ENCOUNTER — Encounter: Payer: Self-pay | Admitting: Internal Medicine

## 2018-05-22 ENCOUNTER — Ambulatory Visit (INDEPENDENT_AMBULATORY_CARE_PROVIDER_SITE_OTHER): Payer: Medicare Other | Admitting: Internal Medicine

## 2018-05-22 VITALS — BP 132/78 | HR 69 | Temp 98.2°F | Ht 67.5 in | Wt 168.0 lb

## 2018-05-22 DIAGNOSIS — Z23 Encounter for immunization: Secondary | ICD-10-CM | POA: Diagnosis not present

## 2018-05-22 DIAGNOSIS — D692 Other nonthrombocytopenic purpura: Secondary | ICD-10-CM | POA: Diagnosis not present

## 2018-05-22 DIAGNOSIS — E039 Hypothyroidism, unspecified: Secondary | ICD-10-CM

## 2018-05-22 DIAGNOSIS — Z7189 Other specified counseling: Secondary | ICD-10-CM

## 2018-05-22 DIAGNOSIS — N400 Enlarged prostate without lower urinary tract symptoms: Secondary | ICD-10-CM | POA: Diagnosis not present

## 2018-05-22 DIAGNOSIS — Z0000 Encounter for general adult medical examination without abnormal findings: Secondary | ICD-10-CM

## 2018-05-22 DIAGNOSIS — R260 Ataxic gait: Secondary | ICD-10-CM

## 2018-05-22 LAB — COMPREHENSIVE METABOLIC PANEL
ALT: 18 U/L (ref 0–53)
AST: 22 U/L (ref 0–37)
Albumin: 4 g/dL (ref 3.5–5.2)
Alkaline Phosphatase: 94 U/L (ref 39–117)
BILIRUBIN TOTAL: 0.8 mg/dL (ref 0.2–1.2)
BUN: 17 mg/dL (ref 6–23)
CALCIUM: 10.1 mg/dL (ref 8.4–10.5)
CHLORIDE: 104 meq/L (ref 96–112)
CO2: 29 meq/L (ref 19–32)
Creatinine, Ser: 0.88 mg/dL (ref 0.40–1.50)
GFR: 86.18 mL/min (ref >60.00)
Glucose, Bld: 96 mg/dL (ref 70–99)
Potassium: 4.4 mEq/L (ref 3.5–5.1)
Sodium: 138 mEq/L (ref 135–145)
Total Protein: 7.4 g/dL (ref 6.0–8.3)

## 2018-05-22 LAB — TSH: TSH: 1.03 u[IU]/mL (ref 0.35–4.50)

## 2018-05-22 LAB — CBC
HCT: 39.5 % (ref 39.0–52.0)
Hemoglobin: 13.4 g/dL (ref 13.0–17.0)
MCHC: 33.9 g/dL (ref 30.0–36.0)
MCV: 94 fl (ref 78.0–100.0)
PLATELETS: 179 10*3/uL (ref 150.0–400.0)
RBC: 4.21 Mil/uL — AB (ref 4.22–5.81)
RDW: 14 % (ref 11.5–15.5)
WBC: 5.3 10*3/uL (ref 4.0–10.5)

## 2018-05-22 LAB — T4, FREE: FREE T4: 0.96 ng/dL (ref 0.60–1.60)

## 2018-05-22 NOTE — Assessment & Plan Note (Signed)
Mild symptoms Okay without meds

## 2018-05-22 NOTE — Assessment & Plan Note (Signed)
Will check CBC 

## 2018-05-22 NOTE — Assessment & Plan Note (Signed)
No falls using cane or rollator

## 2018-05-22 NOTE — Progress Notes (Signed)
Hearing Screening Comments: Has hearing aids. Not wearing them today. Vision Screening Comments: March 2019: Had Cataract Surgery

## 2018-05-22 NOTE — Assessment & Plan Note (Signed)
I have personally reviewed the Medicare Annual Wellness questionnaire and have noted 1. The patient's medical and social history 2. Their use of alcohol, tobacco or illicit drugs 3. Their current medications and supplements 4. The patient's functional ability including ADL's, fall risks, home safety risks and hearing or visual             impairment. 5. Diet and physical activities 6. Evidence for depression or mood disorders  The patients weight, height, BMI and visual acuity have been recorded in the chart I have made referrals, counseling and provided education to the patient based review of the above and I have provided the pt with a written personalized care plan for preventive services.  I have provided you with a copy of your personalized plan for preventive services. Please take the time to review along with your updated medication list.  Pneumovax booster today Flu vaccine yearly No cancer screening due to age Consider shingrix Does well with exercise

## 2018-05-22 NOTE — Addendum Note (Signed)
Addended by: Pilar Grammes on: 05/22/2018 11:40 AM   Modules accepted: Orders

## 2018-05-22 NOTE — Progress Notes (Signed)
Subjective:    Patient ID: Adam Bentley, male    DOB: May 15, 1924, 84 y.o.   MRN: [id number]  HPI Here for Medicare wellness visit and follow up of chronic health conditions Reviewed form and advanced directives Reviewed other doctors Had cataract surgery---vision is good Has hearing aides--for social events Walks and exercises in weight room regularly Occasional glass or wine or beer No tobacco No depression or anhedonia Doesn't drive. Main meal from [facility]-- niece will bring him to go shopping or he uses the bus. Cleaner once a week--he does other instrumental ADLs No sig memory issues Has dog--walks him  Continues on the thyroid medication Energy levels are fine Weight stable No apparent problems  Bruises easily Gets blood under the surface Trying to use skin ointment  Still uses cane with walking Uses the rollator at times also--for longer walks No falls  Voids okay Some urgency---or he some leakage if not fast Nocturia x 2 and stable  Current Outpatient Medications on File Prior to Visit  Medication Sig Dispense Refill  . aspirin 81 MG tablet Take 81 mg by mouth daily.    . cyanocobalamin 1000 MCG tablet Take 5,000 mcg by mouth daily.     Marland Kitchen levothyroxine (SYNTHROID, LEVOTHROID) 100 MCG tablet TAKE ONE TABLET BY MOUTH ONCE DAILY 90 tablet 3  . Multiple Vitamins-Minerals (OCUVITE PO) Take by mouth.     No current facility-administered medications on file prior to visit.     No Known Allergies  Past Medical History:  Diagnosis Date  . BPH (benign prostatic hypertrophy)   . Dental bridge present    permanent upper  . Diplopia 1986   concussion  . Hearing aid worn    bilateral  . History of colon cancer   . Hyperlipidemia   . Hypothyroidism   . Thyroid disease   . Wears dentures    full lower    Past Surgical History:  Procedure Laterality Date  . CATARACT EXTRACTION W/PHACO Left 08/21/2017   Procedure: CATARACT EXTRACTION PHACO AND  INTRAOCULAR LENS PLACEMENT ([place]) LEFT;  Surgeon: Eulogio Bear, MD;  Location: [place];  Service: Ophthalmology;  Laterality: Left;  . CATARACT EXTRACTION W/PHACO Right 09/10/2017   Procedure: CATARACT EXTRACTION PHACO AND INTRAOCULAR LENS PLACEMENT ([place]) RIGHT;  Surgeon: Eulogio Bear, MD;  Location: [place];  Service: Ophthalmology;  Laterality: Right;  . HERNIA REPAIR    . PARTIAL COLECTOMY  03/1999   ([place]) chemo with Choksi  . TURP [place]    History reviewed. No pertinent family history.  Social History   Socioeconomic History  . Marital status: Single    Spouse name: Not on file  . Number of children: Not on file  . Years of education: Not on file  . Highest education level: Not on file  Occupational History  . 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 Financial resource strain: Not on file  . Food insecurity:    Worry: Not on file    Inability: Not on file  . Transportation needs:    Medical: Not on file    Non-medical: Not on file  Tobacco Use  . Smoking status: Never Smoker  . Smokeless tobacco: Never Used  Substance and Sexual Activity  . Alcohol use: Yes    Comment: 2 beers/mo  . Drug use: No  . Sexual activity: Not on file  Lifestyle  . Physical activity:  Days per week: Not on file    Minutes per session: Not on file  . Stress: Not on file  Relationships  . Social connections:    Talks on phone: Not on file    Gets together: Not on file    Attends religious service: Not on file    Active member of club or organization: Not on file    Attends meetings of clubs or organizations: Not on file    Relationship status: Not on file  . Intimate partner violence:    Fear of current or ex partner: Not on file    Emotionally abused: Not on file    Physically abused: Not on file    Forced sexual activity: Not on file  Other Topics Concern  . Not on file  Social  History Narrative   Has living will    Requests niece [name] (or nephew Delfino Lovett) as health care power of attorney   Has DNR already from me   No tube feeds if cognitively unaware (unless clearly seems reversible)   Review of Systems Appetite is okay Weight is stable Sleeps well Wears seat belt Teeth are okay---keeps up No sig skin problems---has appt with dermatologist coming up No sig back or joint pain No heartburn or dysphagia No chest pain or SOB No dizziness or syncope---takes it slow getting up in the morning though     Objective:   Physical Exam  Constitutional: He is oriented to person, place, and time. He appears well-developed.  HENT:  Mouth/Throat: Oropharynx is clear and moist. No oropharyngeal exudate.  Neck: No thyromegaly present.  Cardiovascular: Normal rate, regular rhythm and normal heart sounds. Exam reveals no gallop.  No murmur heard. Faint pedal pulses  Respiratory: Effort normal and breath sounds normal. No respiratory distress. He has no wheezes. He has no rales.  GI: Soft. There is no tenderness.  Musculoskeletal: He exhibits no edema or tenderness.  Lymphadenopathy:    He has no cervical adenopathy.  Neurological: He is alert and oriented to person, place, and time.  President--- "Trump, Clinton----Bush" [id number] D-l-o-b Recall 3/3  Skin: No rash noted.  Scattered purpuric areas--mostly on arms  Psychiatric: He has a normal mood and affect. His behavior is normal.           Assessment & Plan:

## 2018-05-22 NOTE — Assessment & Plan Note (Signed)
Has DNR 

## 2018-05-22 NOTE — Assessment & Plan Note (Signed)
Seems euthyroid ?Will check labs ?

## 2018-07-08 DIAGNOSIS — R278 Other lack of coordination: Secondary | ICD-10-CM | POA: Diagnosis not present

## 2018-07-08 DIAGNOSIS — R293 Abnormal posture: Secondary | ICD-10-CM | POA: Diagnosis not present

## 2018-07-08 DIAGNOSIS — R94121 Abnormal vestibular function study: Secondary | ICD-10-CM | POA: Diagnosis not present

## 2018-07-08 DIAGNOSIS — M6281 Muscle weakness (generalized): Secondary | ICD-10-CM | POA: Diagnosis not present

## 2018-07-11 DIAGNOSIS — R94121 Abnormal vestibular function study: Secondary | ICD-10-CM | POA: Diagnosis not present

## 2018-07-11 DIAGNOSIS — R293 Abnormal posture: Secondary | ICD-10-CM | POA: Diagnosis not present

## 2018-07-11 DIAGNOSIS — R278 Other lack of coordination: Secondary | ICD-10-CM | POA: Diagnosis not present

## 2018-07-11 DIAGNOSIS — M6281 Muscle weakness (generalized): Secondary | ICD-10-CM | POA: Diagnosis not present

## 2018-07-17 DIAGNOSIS — R278 Other lack of coordination: Secondary | ICD-10-CM | POA: Diagnosis not present

## 2018-07-17 DIAGNOSIS — M6281 Muscle weakness (generalized): Secondary | ICD-10-CM | POA: Diagnosis not present

## 2018-07-17 DIAGNOSIS — R94121 Abnormal vestibular function study: Secondary | ICD-10-CM | POA: Diagnosis not present

## 2018-07-17 DIAGNOSIS — R293 Abnormal posture: Secondary | ICD-10-CM | POA: Diagnosis not present

## 2018-07-19 DIAGNOSIS — R293 Abnormal posture: Secondary | ICD-10-CM | POA: Diagnosis not present

## 2018-07-19 DIAGNOSIS — R94121 Abnormal vestibular function study: Secondary | ICD-10-CM | POA: Diagnosis not present

## 2018-07-19 DIAGNOSIS — M6281 Muscle weakness (generalized): Secondary | ICD-10-CM | POA: Diagnosis not present

## 2018-07-19 DIAGNOSIS — R278 Other lack of coordination: Secondary | ICD-10-CM | POA: Diagnosis not present

## 2018-07-22 ENCOUNTER — Other Ambulatory Visit: Payer: Self-pay | Admitting: Internal Medicine

## 2018-07-24 DIAGNOSIS — M6281 Muscle weakness (generalized): Secondary | ICD-10-CM | POA: Diagnosis not present

## 2018-07-24 DIAGNOSIS — R94121 Abnormal vestibular function study: Secondary | ICD-10-CM | POA: Diagnosis not present

## 2018-07-24 DIAGNOSIS — R278 Other lack of coordination: Secondary | ICD-10-CM | POA: Diagnosis not present

## 2018-07-24 DIAGNOSIS — R293 Abnormal posture: Secondary | ICD-10-CM | POA: Diagnosis not present

## 2018-07-26 DIAGNOSIS — R94121 Abnormal vestibular function study: Secondary | ICD-10-CM | POA: Diagnosis not present

## 2018-07-26 DIAGNOSIS — R293 Abnormal posture: Secondary | ICD-10-CM | POA: Diagnosis not present

## 2018-07-26 DIAGNOSIS — R278 Other lack of coordination: Secondary | ICD-10-CM | POA: Diagnosis not present

## 2018-07-26 DIAGNOSIS — M6281 Muscle weakness (generalized): Secondary | ICD-10-CM | POA: Diagnosis not present

## 2018-07-30 DIAGNOSIS — R94121 Abnormal vestibular function study: Secondary | ICD-10-CM | POA: Diagnosis not present

## 2018-07-30 DIAGNOSIS — R278 Other lack of coordination: Secondary | ICD-10-CM | POA: Diagnosis not present

## 2018-07-30 DIAGNOSIS — R293 Abnormal posture: Secondary | ICD-10-CM | POA: Diagnosis not present

## 2018-07-30 DIAGNOSIS — M6281 Muscle weakness (generalized): Secondary | ICD-10-CM | POA: Diagnosis not present

## 2018-08-01 DIAGNOSIS — R278 Other lack of coordination: Secondary | ICD-10-CM | POA: Diagnosis not present

## 2018-08-01 DIAGNOSIS — M6281 Muscle weakness (generalized): Secondary | ICD-10-CM | POA: Diagnosis not present

## 2018-08-01 DIAGNOSIS — R293 Abnormal posture: Secondary | ICD-10-CM | POA: Diagnosis not present

## 2018-08-01 DIAGNOSIS — R94121 Abnormal vestibular function study: Secondary | ICD-10-CM | POA: Diagnosis not present

## 2018-08-07 DIAGNOSIS — R94121 Abnormal vestibular function study: Secondary | ICD-10-CM | POA: Diagnosis not present

## 2018-08-07 DIAGNOSIS — R278 Other lack of coordination: Secondary | ICD-10-CM | POA: Diagnosis not present

## 2018-08-07 DIAGNOSIS — R293 Abnormal posture: Secondary | ICD-10-CM | POA: Diagnosis not present

## 2018-08-07 DIAGNOSIS — M6281 Muscle weakness (generalized): Secondary | ICD-10-CM | POA: Diagnosis not present

## 2018-08-09 DIAGNOSIS — R278 Other lack of coordination: Secondary | ICD-10-CM | POA: Diagnosis not present

## 2018-08-09 DIAGNOSIS — R94121 Abnormal vestibular function study: Secondary | ICD-10-CM | POA: Diagnosis not present

## 2018-08-09 DIAGNOSIS — M6281 Muscle weakness (generalized): Secondary | ICD-10-CM | POA: Diagnosis not present

## 2018-08-09 DIAGNOSIS — R293 Abnormal posture: Secondary | ICD-10-CM | POA: Diagnosis not present

## 2018-08-14 DIAGNOSIS — R94121 Abnormal vestibular function study: Secondary | ICD-10-CM | POA: Diagnosis not present

## 2018-08-14 DIAGNOSIS — M6281 Muscle weakness (generalized): Secondary | ICD-10-CM | POA: Diagnosis not present

## 2018-08-14 DIAGNOSIS — R293 Abnormal posture: Secondary | ICD-10-CM | POA: Diagnosis not present

## 2018-08-14 DIAGNOSIS — R278 Other lack of coordination: Secondary | ICD-10-CM | POA: Diagnosis not present

## 2018-08-16 DIAGNOSIS — R94121 Abnormal vestibular function study: Secondary | ICD-10-CM | POA: Diagnosis not present

## 2018-08-16 DIAGNOSIS — R278 Other lack of coordination: Secondary | ICD-10-CM | POA: Diagnosis not present

## 2018-08-16 DIAGNOSIS — R293 Abnormal posture: Secondary | ICD-10-CM | POA: Diagnosis not present

## 2018-08-16 DIAGNOSIS — M6281 Muscle weakness (generalized): Secondary | ICD-10-CM | POA: Diagnosis not present

## 2018-08-19 DIAGNOSIS — R293 Abnormal posture: Secondary | ICD-10-CM | POA: Diagnosis not present

## 2018-08-19 DIAGNOSIS — R94121 Abnormal vestibular function study: Secondary | ICD-10-CM | POA: Diagnosis not present

## 2018-08-19 DIAGNOSIS — R278 Other lack of coordination: Secondary | ICD-10-CM | POA: Diagnosis not present

## 2018-08-19 DIAGNOSIS — M6281 Muscle weakness (generalized): Secondary | ICD-10-CM | POA: Diagnosis not present

## 2018-08-22 DIAGNOSIS — R293 Abnormal posture: Secondary | ICD-10-CM | POA: Diagnosis not present

## 2018-08-22 DIAGNOSIS — R278 Other lack of coordination: Secondary | ICD-10-CM | POA: Diagnosis not present

## 2018-08-22 DIAGNOSIS — R94121 Abnormal vestibular function study: Secondary | ICD-10-CM | POA: Diagnosis not present

## 2018-08-22 DIAGNOSIS — M6281 Muscle weakness (generalized): Secondary | ICD-10-CM | POA: Diagnosis not present

## 2018-08-29 DIAGNOSIS — R94121 Abnormal vestibular function study: Secondary | ICD-10-CM | POA: Diagnosis not present

## 2018-08-29 DIAGNOSIS — R293 Abnormal posture: Secondary | ICD-10-CM | POA: Diagnosis not present

## 2018-08-29 DIAGNOSIS — M6281 Muscle weakness (generalized): Secondary | ICD-10-CM | POA: Diagnosis not present

## 2018-08-29 DIAGNOSIS — R278 Other lack of coordination: Secondary | ICD-10-CM | POA: Diagnosis not present

## 2018-09-02 DIAGNOSIS — R94121 Abnormal vestibular function study: Secondary | ICD-10-CM | POA: Diagnosis not present

## 2018-09-02 DIAGNOSIS — M6281 Muscle weakness (generalized): Secondary | ICD-10-CM | POA: Diagnosis not present

## 2018-09-02 DIAGNOSIS — R293 Abnormal posture: Secondary | ICD-10-CM | POA: Diagnosis not present

## 2018-09-02 DIAGNOSIS — R278 Other lack of coordination: Secondary | ICD-10-CM | POA: Diagnosis not present

## 2018-09-05 DIAGNOSIS — R278 Other lack of coordination: Secondary | ICD-10-CM | POA: Diagnosis not present

## 2018-09-05 DIAGNOSIS — R293 Abnormal posture: Secondary | ICD-10-CM | POA: Diagnosis not present

## 2018-09-05 DIAGNOSIS — R94121 Abnormal vestibular function study: Secondary | ICD-10-CM | POA: Diagnosis not present

## 2018-09-05 DIAGNOSIS — M6281 Muscle weakness (generalized): Secondary | ICD-10-CM | POA: Diagnosis not present

## 2018-09-12 DIAGNOSIS — R293 Abnormal posture: Secondary | ICD-10-CM | POA: Diagnosis not present

## 2018-09-12 DIAGNOSIS — R94121 Abnormal vestibular function study: Secondary | ICD-10-CM | POA: Diagnosis not present

## 2018-09-12 DIAGNOSIS — M6281 Muscle weakness (generalized): Secondary | ICD-10-CM | POA: Diagnosis not present

## 2018-09-12 DIAGNOSIS — R278 Other lack of coordination: Secondary | ICD-10-CM | POA: Diagnosis not present

## 2018-09-13 DIAGNOSIS — Z23 Encounter for immunization: Secondary | ICD-10-CM | POA: Diagnosis not present

## 2018-09-25 DIAGNOSIS — M6281 Muscle weakness (generalized): Secondary | ICD-10-CM | POA: Diagnosis not present

## 2018-09-25 DIAGNOSIS — R94121 Abnormal vestibular function study: Secondary | ICD-10-CM | POA: Diagnosis not present

## 2018-09-25 DIAGNOSIS — R293 Abnormal posture: Secondary | ICD-10-CM | POA: Diagnosis not present

## 2018-09-25 DIAGNOSIS — R278 Other lack of coordination: Secondary | ICD-10-CM | POA: Diagnosis not present

## 2018-10-09 DIAGNOSIS — R94121 Abnormal vestibular function study: Secondary | ICD-10-CM | POA: Diagnosis not present

## 2018-10-09 DIAGNOSIS — R293 Abnormal posture: Secondary | ICD-10-CM | POA: Diagnosis not present

## 2018-10-09 DIAGNOSIS — M6281 Muscle weakness (generalized): Secondary | ICD-10-CM | POA: Diagnosis not present

## 2018-10-09 DIAGNOSIS — R278 Other lack of coordination: Secondary | ICD-10-CM | POA: Diagnosis not present

## 2018-10-21 DIAGNOSIS — M6281 Muscle weakness (generalized): Secondary | ICD-10-CM | POA: Diagnosis not present

## 2018-10-21 DIAGNOSIS — R293 Abnormal posture: Secondary | ICD-10-CM | POA: Diagnosis not present

## 2018-10-21 DIAGNOSIS — R278 Other lack of coordination: Secondary | ICD-10-CM | POA: Diagnosis not present

## 2018-11-07 DIAGNOSIS — R278 Other lack of coordination: Secondary | ICD-10-CM | POA: Diagnosis not present

## 2018-11-07 DIAGNOSIS — M6281 Muscle weakness (generalized): Secondary | ICD-10-CM | POA: Diagnosis not present

## 2018-11-07 DIAGNOSIS — R293 Abnormal posture: Secondary | ICD-10-CM | POA: Diagnosis not present

## 2018-11-14 DIAGNOSIS — R293 Abnormal posture: Secondary | ICD-10-CM | POA: Diagnosis not present

## 2018-11-14 DIAGNOSIS — R278 Other lack of coordination: Secondary | ICD-10-CM | POA: Diagnosis not present

## 2018-11-14 DIAGNOSIS — M6281 Muscle weakness (generalized): Secondary | ICD-10-CM | POA: Diagnosis not present

## 2018-11-19 DIAGNOSIS — R278 Other lack of coordination: Secondary | ICD-10-CM | POA: Diagnosis not present

## 2018-11-19 DIAGNOSIS — R293 Abnormal posture: Secondary | ICD-10-CM | POA: Diagnosis not present

## 2018-11-19 DIAGNOSIS — M6281 Muscle weakness (generalized): Secondary | ICD-10-CM | POA: Diagnosis not present

## 2018-11-28 DIAGNOSIS — M6281 Muscle weakness (generalized): Secondary | ICD-10-CM | POA: Diagnosis not present

## 2018-11-28 DIAGNOSIS — R278 Other lack of coordination: Secondary | ICD-10-CM | POA: Diagnosis not present

## 2018-11-28 DIAGNOSIS — R293 Abnormal posture: Secondary | ICD-10-CM | POA: Diagnosis not present

## 2018-12-04 DIAGNOSIS — R278 Other lack of coordination: Secondary | ICD-10-CM | POA: Diagnosis not present

## 2018-12-04 DIAGNOSIS — M6281 Muscle weakness (generalized): Secondary | ICD-10-CM | POA: Diagnosis not present

## 2018-12-04 DIAGNOSIS — R293 Abnormal posture: Secondary | ICD-10-CM | POA: Diagnosis not present

## 2018-12-09 DIAGNOSIS — R278 Other lack of coordination: Secondary | ICD-10-CM | POA: Diagnosis not present

## 2018-12-09 DIAGNOSIS — M6281 Muscle weakness (generalized): Secondary | ICD-10-CM | POA: Diagnosis not present

## 2018-12-09 DIAGNOSIS — R293 Abnormal posture: Secondary | ICD-10-CM | POA: Diagnosis not present

## 2018-12-10 DIAGNOSIS — H6122 Impacted cerumen, left ear: Secondary | ICD-10-CM | POA: Diagnosis not present

## 2018-12-10 DIAGNOSIS — H903 Sensorineural hearing loss, bilateral: Secondary | ICD-10-CM | POA: Diagnosis not present

## 2018-12-24 ENCOUNTER — Encounter: Payer: Self-pay | Admitting: Internal Medicine

## 2018-12-24 ENCOUNTER — Ambulatory Visit (INDEPENDENT_AMBULATORY_CARE_PROVIDER_SITE_OTHER): Payer: Medicare Other | Admitting: Internal Medicine

## 2018-12-24 ENCOUNTER — Ambulatory Visit (INDEPENDENT_AMBULATORY_CARE_PROVIDER_SITE_OTHER)
Admission: RE | Admit: 2018-12-24 | Discharge: 2018-12-24 | Disposition: A | Discharge status: Home / Self Care | Payer: Medicare Other | Source: Ambulatory Visit | Attending: Internal Medicine | Admitting: Internal Medicine

## 2018-12-24 VITALS — BP 136/80 | HR 75 | Temp 98.0°F | Resp 12 | Ht 67.5 in | Wt 156.0 lb

## 2018-12-24 DIAGNOSIS — R05 Cough: Secondary | ICD-10-CM

## 2018-12-24 DIAGNOSIS — E441 Mild protein-calorie malnutrition: Secondary | ICD-10-CM | POA: Diagnosis not present

## 2018-12-24 DIAGNOSIS — R053 Chronic cough: Secondary | ICD-10-CM | POA: Insufficient documentation

## 2018-12-24 NOTE — Assessment & Plan Note (Signed)
Has lost weight again Likely not enough calorie intake Niece will get him more ensure and he will increase to bid

## 2018-12-24 NOTE — Progress Notes (Signed)
Subjective:    Patient ID: Adam Bentley, male    DOB: 1924-12-07, 84 y.o.   MRN: [id number]  HPI Here with niece [name] for respiratory infection  Has been sick for about a month Now with lots of phlegm Cough, lots of phlegm (had been using mucinex) No fever No chills or sweats No SOB  Some nasal congestion and drainage Hearing is worse---went to ENT and had some dried blood when cleaned out Some post nasal drip  Tried 1 tylenol  No other meds  Current Outpatient Medications on File Prior to Visit  Medication Sig Dispense Refill  . aspirin 81 MG tablet Take 81 mg by mouth daily.    . cyanocobalamin 1000 MCG tablet Take 5,000 mcg by mouth daily.     Marland Kitchen levothyroxine (SYNTHROID, LEVOTHROID) 100 MCG tablet TAKE 1 TABLET BY MOUTH ONCE DAILY 90 tablet 3  . Multiple Vitamins-Minerals (OCUVITE PO) Take by mouth.     No current facility-administered medications on file prior to visit.     No Known Allergies  Past Medical History:  Diagnosis Date  . BPH (benign prostatic hypertrophy)   . Dental bridge present    permanent upper  . Diplopia 1986   concussion  . Hearing aid worn    bilateral  . History of colon cancer   . Hyperlipidemia   . Hypothyroidism   . Thyroid disease   . Wears dentures    full lower    Past Surgical History:  Procedure Laterality Date  . CATARACT EXTRACTION W/PHACO Left 08/21/2017   Procedure: CATARACT EXTRACTION PHACO AND INTRAOCULAR LENS PLACEMENT ([place]) LEFT;  Surgeon: Eulogio Bear, MD;  Location: [place];  Service: Ophthalmology;  Laterality: Left;  . CATARACT EXTRACTION W/PHACO Right 09/10/2017   Procedure: CATARACT EXTRACTION PHACO AND INTRAOCULAR LENS PLACEMENT ([place]) RIGHT;  Surgeon: Eulogio Bear, MD;  Location: [place];  Service: Ophthalmology;  Laterality: Right;  . HERNIA REPAIR    . PARTIAL COLECTOMY  03/1999   ([place]) chemo with Choksi  . TURP [place]    History reviewed. No  pertinent family history.  Social History   Socioeconomic History  . Marital status: Single    Spouse name: Not on file  . Number of children: Not on file  . Years of education: Not on file  . Highest education level: Not on file  Occupational History  . 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 Financial resource strain: Not on file  . Food insecurity:    Worry: Not on file    Inability: Not on file  . Transportation needs:    Medical: Not on file    Non-medical: Not on file  Tobacco Use  . Smoking status: Never Smoker  . Smokeless tobacco: Never Used  Substance and Sexual Activity  . Alcohol use: Yes    Comment: 2 beers/mo  . Drug use: No  . Sexual activity: Not on file  Lifestyle  . Physical activity:    Days per week: Not on file    Minutes per session: Not on file  . Stress: Not on file  Relationships  . Social connections:    Talks on phone: Not on file    Gets together: Not on file    Attends religious service: Not on file    Active member of club or organization: Not on file    Attends meetings of clubs or organizations: Not  on file    Relationship status: Not on file  . Intimate partner violence:    Fear of current or ex partner: Not on file    Emotionally abused: Not on file    Physically abused: Not on file    Forced sexual activity: Not on file  Other Topics Concern  . Not on file  Social History Narrative   Has living will    Requests niece [name] (or nephew Delfino Lovett) as health care power of attorney   Has DNR already from me   No tube feeds if cognitively unaware (unless clearly seems reversible)   Review of Systems  No vomiting or diarrhea Weight down 12#---trying to exercise and eat more vegetables Doing PT which is helping for balance Usually having 1 ensure a day    Objective:   Physical Exam  Constitutional: No distress.  Mild wasting  HENT:  Mouth/Throat: Oropharynx is clear and moist.  No oropharyngeal exudate.  No sinus tenderness Mild nasal congestion TMs normal   Neck: No thyromegaly present.  Respiratory: Effort normal. No respiratory distress. He has no wheezes. He has no rales.  Decreased breath sounds but clear  Lymphadenopathy:    He has no cervical adenopathy.           Assessment & Plan:

## 2018-12-24 NOTE — Assessment & Plan Note (Addendum)
Doesn't appear too ill No clear bacterial infection Will check CXR especially given the weight loss  I don't see anything on the CXR Will await overread---no Rx for now

## 2018-12-27 DIAGNOSIS — R293 Abnormal posture: Secondary | ICD-10-CM | POA: Diagnosis not present

## 2018-12-27 DIAGNOSIS — M6281 Muscle weakness (generalized): Secondary | ICD-10-CM | POA: Diagnosis not present

## 2018-12-27 DIAGNOSIS — R278 Other lack of coordination: Secondary | ICD-10-CM | POA: Diagnosis not present

## 2019-01-03 DIAGNOSIS — R293 Abnormal posture: Secondary | ICD-10-CM | POA: Diagnosis not present

## 2019-01-03 DIAGNOSIS — M6281 Muscle weakness (generalized): Secondary | ICD-10-CM | POA: Diagnosis not present

## 2019-01-03 DIAGNOSIS — R278 Other lack of coordination: Secondary | ICD-10-CM | POA: Diagnosis not present

## 2019-04-02 IMAGING — DX DG CHEST 2V
2 series · 2 of 2 positions shown · non-contrast
Comparison: None.

CLINICAL DATA: [AGE] male with a history cough for 1 month

EXAM:
CHEST - 2 VIEW

[chest pa]
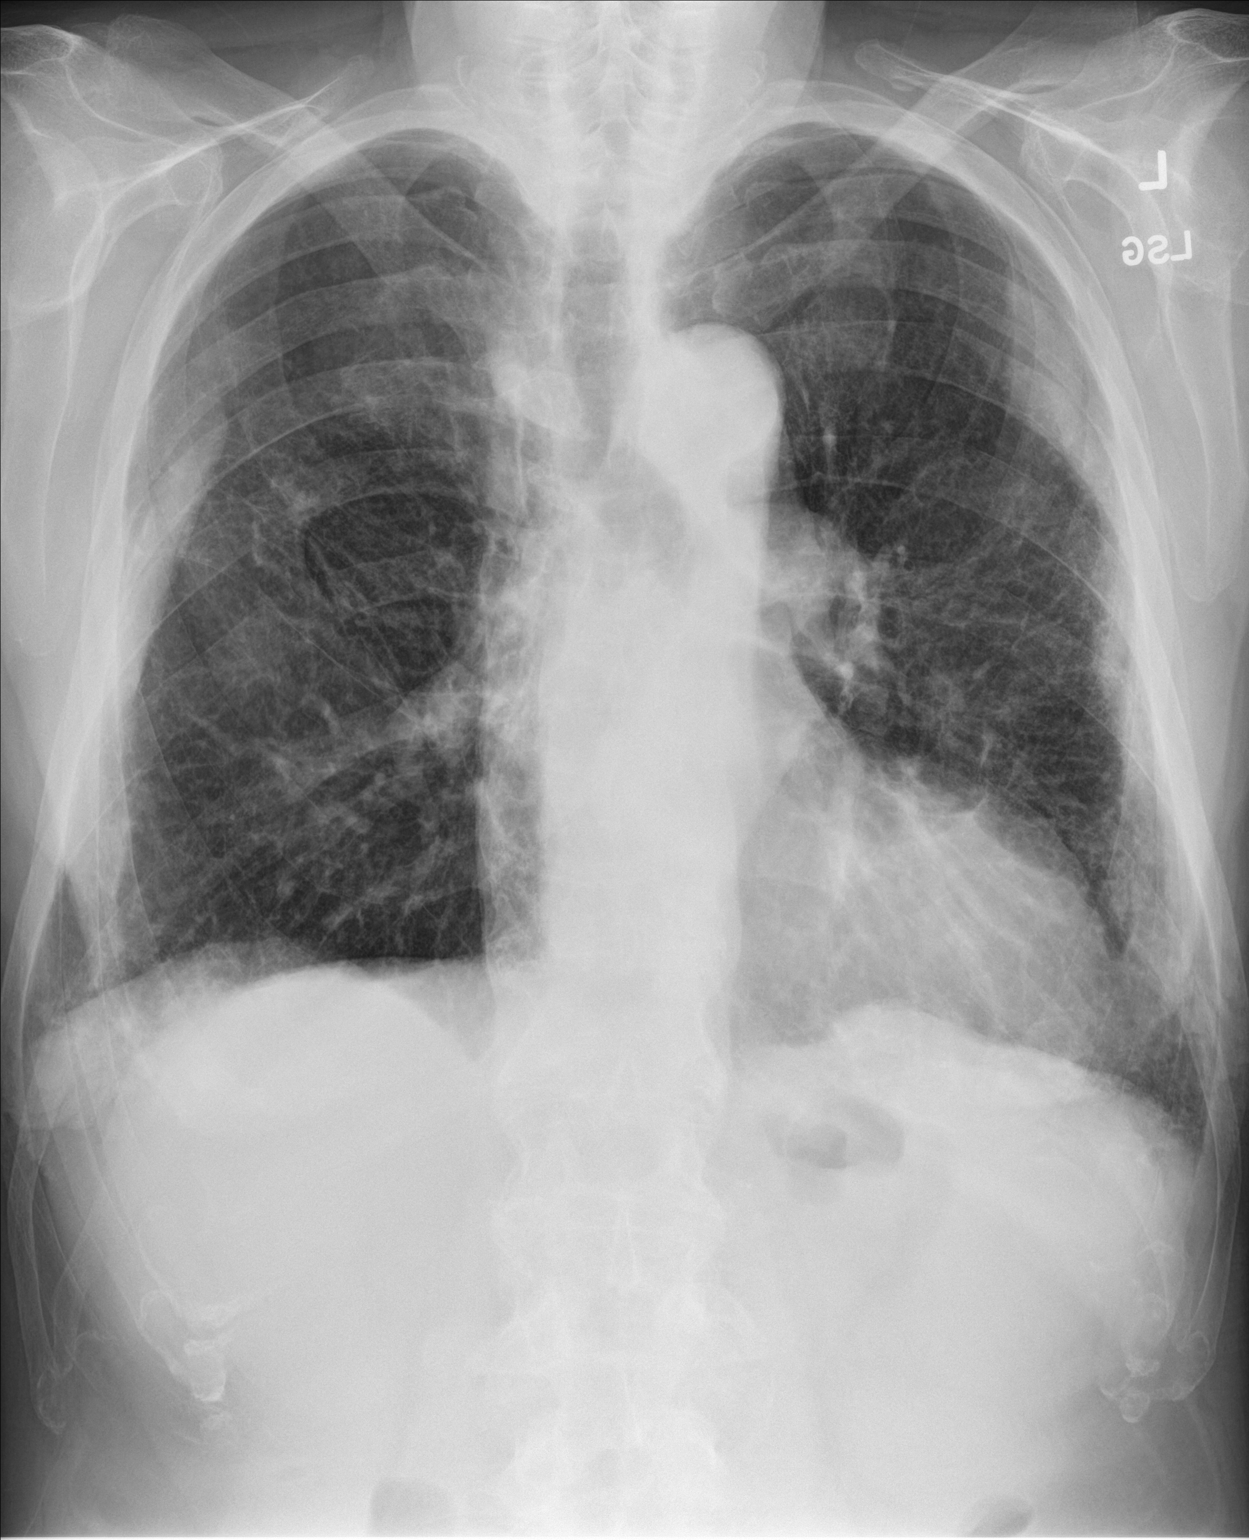

[chest lat]
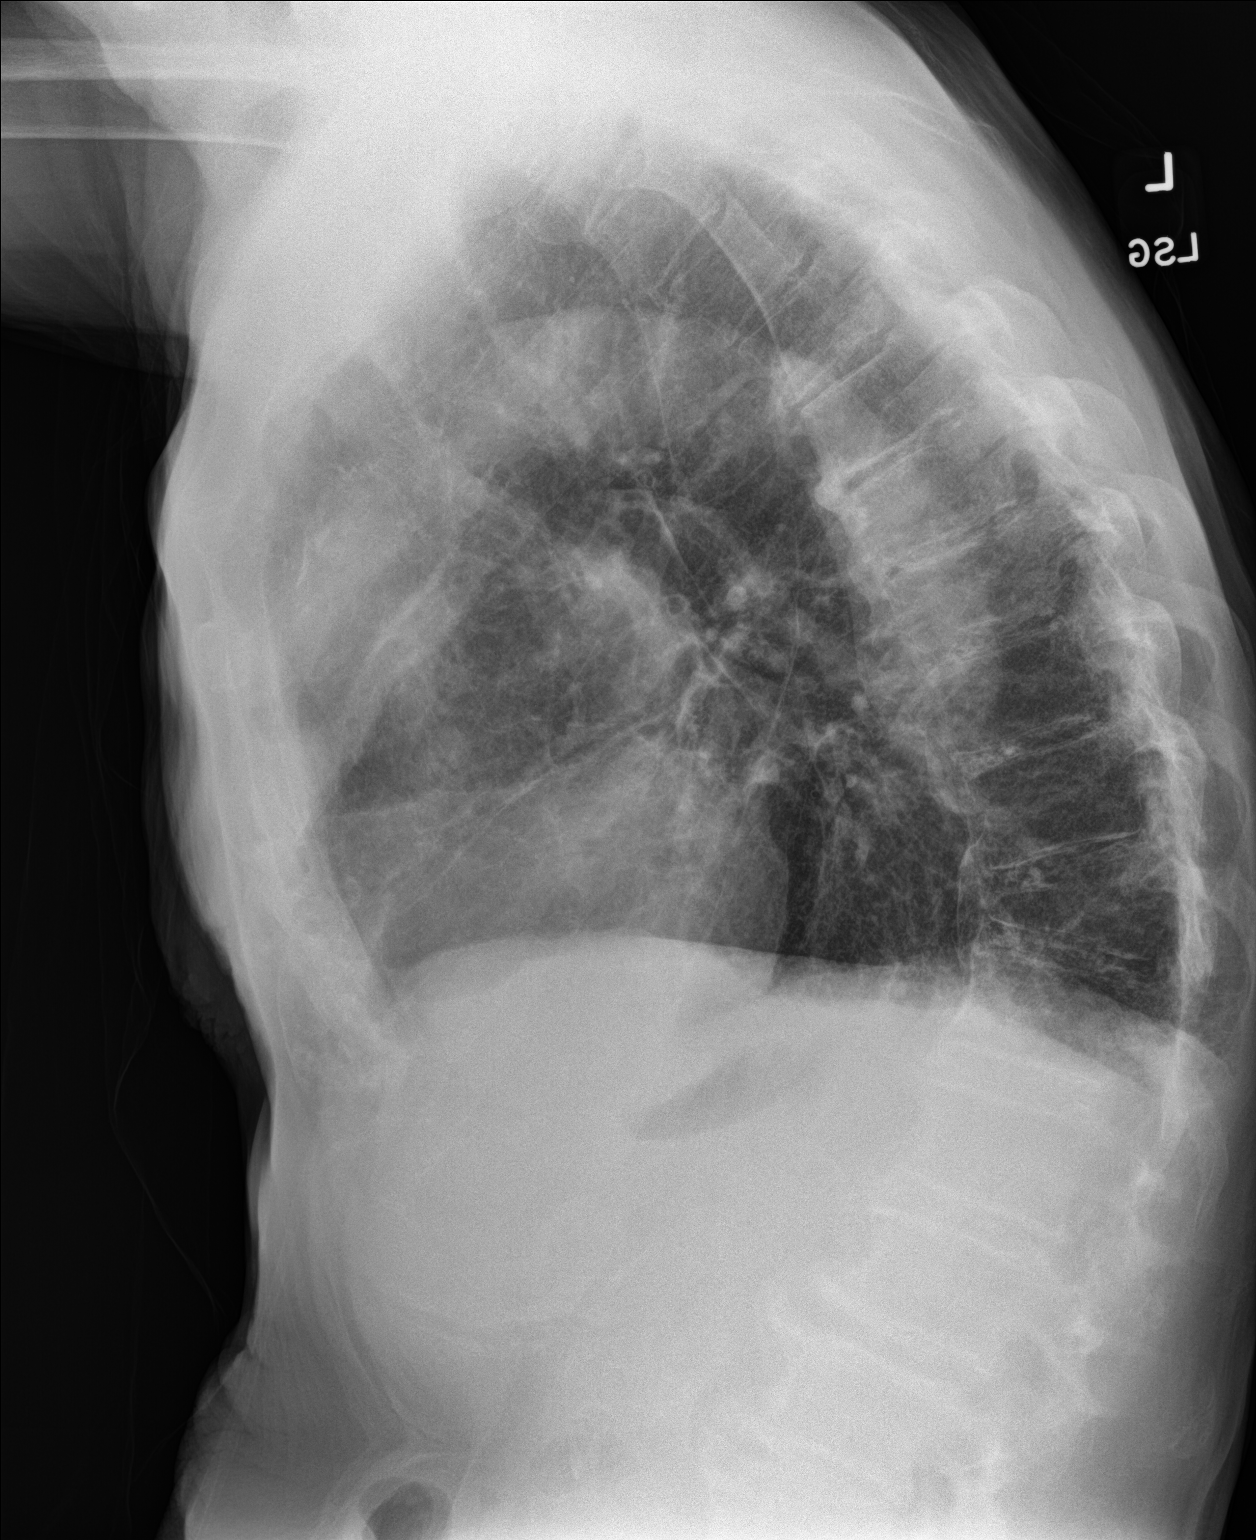

[2 of 2 positions shown; findings below may reference images not displayed]

FINDINGS: Cardiomediastinal silhouette within normal limits. No evidence of
central vascular congestion.

Stigmata of emphysema, with increased retrosternal airspace,
flattened hemidiaphragms, increased AP diameter, and hyperinflation
on the AP view..

Peripheral opacities on the frontal view of the left and right chest
with reticulonodular pattern of opacity. No pneumothorax or pleural
effusion.

Accentuated kyphotic curvature with lower thoracic compression
fracture of indeterminate age. Osteopenia. Degenerative changes.
IMPRESSION: Emphysema with peripheral reticulonodular opacities of the lungs,
potentially representing chronic interstitial disease, however,
multifocal infection difficult to exclude. Further evaluation with
high-resolution chest CT may be useful, as well as referral for
pulmonary evaluation.

Degenerative changes of the spine with age-indeterminate compression
fracture of lower thoracic spine.

## 2019-04-24 ENCOUNTER — Telehealth: Payer: Self-pay | Admitting: Internal Medicine

## 2019-04-24 MED ORDER — LEVOTHYROXINE SODIUM 100 MCG PO TABS: 100.0000 ug | ORAL_TABLET | Freq: Every day | ORAL | 3 refills | Status: DC

## 2019-04-24 NOTE — Telephone Encounter (Signed)
Rx sent electronically. Left message to let pt know we sent it in.

## 2019-04-24 NOTE — Telephone Encounter (Signed)
Best number [phone] Pt would like to switch drugstores [facility] near [address] in [place]  Levothyroxine 121mcg  Pt has 2 pill left  Please let pt know when this has been called in

## 2019-05-27 ENCOUNTER — Encounter: Payer: Self-pay | Admitting: Internal Medicine

## 2019-05-27 ENCOUNTER — Ambulatory Visit (INDEPENDENT_AMBULATORY_CARE_PROVIDER_SITE_OTHER): Payer: Medicare Other | Admitting: Internal Medicine

## 2019-05-27 VITALS — Temp 97.7°F | Ht 67.5 in | Wt 155.0 lb

## 2019-05-27 DIAGNOSIS — D692 Other nonthrombocytopenic purpura: Secondary | ICD-10-CM

## 2019-05-27 DIAGNOSIS — Z7189 Other specified counseling: Secondary | ICD-10-CM

## 2019-05-27 DIAGNOSIS — N400 Enlarged prostate without lower urinary tract symptoms: Secondary | ICD-10-CM

## 2019-05-27 DIAGNOSIS — E039 Hypothyroidism, unspecified: Secondary | ICD-10-CM

## 2019-05-27 DIAGNOSIS — E441 Mild protein-calorie malnutrition: Secondary | ICD-10-CM

## 2019-05-27 DIAGNOSIS — Z0000 Encounter for general adult medical examination without abnormal findings: Secondary | ICD-10-CM

## 2019-05-27 NOTE — Assessment & Plan Note (Signed)
Has DNR 

## 2019-05-27 NOTE — Assessment & Plan Note (Signed)
I have personally reviewed the Medicare Annual Wellness questionnaire and have noted 1. The patient's medical and social history 2. Their use of alcohol, tobacco or illicit drugs 3. Their current medications and supplements 4. The patient's functional ability including ADL's, fall risks, home safety risks and hearing or visual             impairment. 5. Diet and physical activities 6. Evidence for depression or mood disorders  The patients weight, height, BMI and visual acuity have been recorded in the chart I have made referrals, counseling and provided education to the patient based review of the above and I have provided the pt with a written personalized care plan for preventive services.  I have provided you with a copy of your personalized plan for preventive services. Please take the time to review along with your updated medication list.  No cancer screening Flu vaccine in the fall Stay active

## 2019-05-27 NOTE — Assessment & Plan Note (Signed)
Weight has stabilized 20# below his regular weight Ensure bid still

## 2019-05-27 NOTE — Progress Notes (Signed)
Subjective:    Patient ID: Adam Bentley, male    DOB: [birth year], 85 y.o.   MRN: [id number]  HPI Visit for Medicare wellness and follow up of chronic health conditions Couldn't figure out way to do video visit  Interactive audio and video telecommunications were attempted between this provider and patient, however failed, due to patient having technical difficulties OR patient did not have access to video capability.  We continued and completed visit with audio only.   Virtual Visit via Telephone Note  I connected with Adam Bentley on 05/27/19 at  8:45 AM EDT by telephone and verified that I am speaking with the correct person using two identifiers.  Location: Patient: home Provider: office   I discussed the limitations, risks, security and privacy concerns of performing an evaluation and management service by telephone and the availability of in person appointments. I also discussed with the patient that there may be a patient responsible charge related to this service. The patient expressed understanding and agreed to proceed.   History of Present Illness: He is feeling okay Does wear mask--but others there are not Niece has been doing some shopping for her The daily meal is being delivered to their units--but tomorrow, they will start having to pick up meals at the cafeteria Continues to exercise---will pedal on exercise bike and walk  Energy levels are okay No change in hair, skin or nails  Did get over cough Some sneezing No SOB or fever  Continues to have easy bruising on arms Always with purpuric areas on arms--wonders about a "blood problem"  Voids okay Uses urinal at night No incontinence--only occasional drip Feels he empties okay  Walks fairly stably Gets mail, around grounds a bit Uses walker or cane  Weight has stabilized Eating okay Drinking 2 ensure a day  No hospitalizations or ER visits this year Other doctors--- dentist (?), Dr Edison Pace  (eye doctor) No tobacco Rare glass of wine Vision is okay--recently got new glasses Has hearing aides--they help No falls  No depression or anhedonia Independent with ADLs Has been doing his cleaning and laundry lately No sig memory issues  Current Outpatient Medications on File Prior to Visit  Medication Sig Dispense Refill  . aspirin 81 MG tablet Take 81 mg by mouth daily.    . cyanocobalamin 1000 MCG tablet Take 5,000 mcg by mouth daily.     Marland Kitchen levothyroxine (SYNTHROID) 100 MCG tablet Take 1 tablet (100 mcg total) by mouth daily. 90 tablet 3  . Multiple Vitamins-Minerals (OCUVITE PO) Take by mouth.     No current facility-administered medications on file prior to visit.     No Known Allergies  Past Medical History:  Diagnosis Date  . BPH (benign prostatic hypertrophy)   . Dental bridge present    permanent upper  . Diplopia 1986   concussion  . Hearing aid worn    bilateral  . History of colon cancer   . Hyperlipidemia   . Hypothyroidism   . Thyroid disease   . Wears dentures    full lower    Past Surgical History:  Procedure Laterality Date  . CATARACT EXTRACTION W/PHACO Left 08/21/2017   Procedure: CATARACT EXTRACTION PHACO AND INTRAOCULAR LENS PLACEMENT ([place]) LEFT;  Surgeon: Eulogio Bear, MD;  Location: [place];  Service: Ophthalmology;  Laterality: Left;  . CATARACT EXTRACTION W/PHACO Right 09/10/2017   Procedure: CATARACT EXTRACTION PHACO AND INTRAOCULAR LENS PLACEMENT ([place]) RIGHT;  Surgeon: Eulogio Bear, MD;  Location: [facility];  Service: Ophthalmology;  Laterality: Right;  . HERNIA REPAIR    . PARTIAL COLECTOMY  03/1999   ([place]) chemo with Choksi  . TURP [place]    History reviewed. No pertinent family history.  Social History   Socioeconomic History  . Marital status: Single    Spouse name: Not on file  . Number of children: Not on file  . Years of education: Not on file  . Highest education level:  Not on file  Occupational History  . 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 Financial resource strain: Not on file  . Food insecurity:    Worry: Not on file    Inability: Not on file  . Transportation needs:    Medical: Not on file    Non-medical: Not on file  Tobacco Use  . Smoking status: Never Smoker  . Smokeless tobacco: Never Used  Substance and Sexual Activity  . Alcohol use: Yes    Comment: 2 beers/mo  . Drug use: No  . Sexual activity: Not on file  Lifestyle  . Physical activity:    Days per week: Not on file    Minutes per session: Not on file  . Stress: Not on file  Relationships  . Social connections:    Talks on phone: Not on file    Gets together: Not on file    Attends religious service: Not on file    Active member of club or organization: Not on file    Attends meetings of clubs or organizations: Not on file    Relationship status: Not on file  . Intimate partner violence:    Fear of current or ex partner: Not on file    Emotionally abused: Not on file    Physically abused: Not on file    Forced sexual activity: Not on file  Other Topics Concern  . Not on file  Social History Narrative   Has living will    Requests niece [name] (or nephew Delfino Lovett) as health care power of attorney   Has DNR already from me   No tube feeds if cognitively unaware (unless clearly seems reversible)    ROS Bridge getting loose--needs dental appt No suspicious skin lesions Wears seat belt Bowels moving okay--no blood No chest pain No sig back or joint pain No heartburn or dysphagia No dizziness or syncope   Observations/Objective: Alert--oriented x 3 President-- "Trump, Obama, Clinton---Bush" [id number] D-r-o-l Recall 3/3  Upbeat and good spirits  Assessment and Plan: See problem list  Follow Up Instructions:    I discussed the assessment and treatment plan with the patient. The patient was  provided an opportunity to ask questions and all were answered. The patient agreed with the plan and demonstrated an understanding of the instructions.   The patient was advised to call back or seek an in-person evaluation if the symptoms worsen or if the condition fails to improve as anticipated.  I provided 19 minutes of non-face-to-face time during this encounter.   Viviana Simpler, MD    Review of Systems     Objective:   Physical Exam         Assessment & Plan:

## 2019-05-27 NOTE — Assessment & Plan Note (Signed)
Same purpura in forearms mostly Will have him stop the aspirin

## 2019-05-27 NOTE — Progress Notes (Signed)
Hearing Screening Comments: Has hearing aids. Wearing them today. Vision Screening Comments: July 2019

## 2019-05-27 NOTE — Assessment & Plan Note (Signed)
Voids okay Seems to empty well No action

## 2019-05-27 NOTE — Assessment & Plan Note (Signed)
Seems euthyroid ?Will check labs ?

## 2019-05-30 ENCOUNTER — Other Ambulatory Visit: Payer: Medicare Other

## 2019-06-03 ENCOUNTER — Other Ambulatory Visit (INDEPENDENT_AMBULATORY_CARE_PROVIDER_SITE_OTHER): Payer: Medicare Other

## 2019-06-03 DIAGNOSIS — E039 Hypothyroidism, unspecified: Secondary | ICD-10-CM

## 2019-06-03 LAB — COMPREHENSIVE METABOLIC PANEL
ALT: 16 U/L (ref 0–53)
AST: 31 U/L (ref 0–37)
Albumin: 3.7 g/dL (ref 3.5–5.2)
Alkaline Phosphatase: 101 U/L (ref 39–117)
BUN: 19 mg/dL (ref 6–23)
CO2: 29 mEq/L (ref 19–32)
Calcium: 10.3 mg/dL (ref 8.4–10.5)
Chloride: 105 mEq/L (ref 96–112)
Creatinine, Ser: 0.82 mg/dL (ref 0.40–1.50)
GFR: 87.77 mL/min (ref >60.00)
Glucose, Bld: 106 mg/dL — ABNORMAL HIGH (ref 70–99)
Potassium: 4.3 mEq/L (ref 3.5–5.1)
Sodium: 139 mEq/L (ref 135–145)
Total Bilirubin: 0.8 mg/dL (ref 0.2–1.2)
Total Protein: 6.9 g/dL (ref 6.0–8.3)

## 2019-06-03 LAB — CBC
HCT: 37.7 % — ABNORMAL LOW (ref 39.0–52.0)
Hemoglobin: 12.7 g/dL — ABNORMAL LOW (ref 13.0–17.0)
MCHC: 33.7 g/dL (ref 30.0–36.0)
MCV: 95.8 fl (ref 78.0–100.0)
Platelets: 177 10*3/uL (ref 150.0–400.0)
RBC: 3.93 Mil/uL — ABNORMAL LOW (ref 4.22–5.81)
RDW: 14.6 % (ref 11.5–15.5)
WBC: 4.9 10*3/uL (ref 4.0–10.5)

## 2019-06-03 LAB — TSH: TSH: 0.78 u[IU]/mL (ref 0.35–4.50)

## 2019-06-03 LAB — T4, FREE: Free T4: 0.98 ng/dL (ref 0.60–1.60)

## 2019-10-02 ENCOUNTER — Ambulatory Visit (INDEPENDENT_AMBULATORY_CARE_PROVIDER_SITE_OTHER): Payer: Medicare Other

## 2019-10-02 DIAGNOSIS — Z23 Encounter for immunization: Secondary | ICD-10-CM

## 2019-12-30 DIAGNOSIS — R278 Other lack of coordination: Secondary | ICD-10-CM | POA: Diagnosis not present

## 2019-12-30 DIAGNOSIS — M6281 Muscle weakness (generalized): Secondary | ICD-10-CM | POA: Diagnosis not present

## 2019-12-30 DIAGNOSIS — R262 Difficulty in walking, not elsewhere classified: Secondary | ICD-10-CM | POA: Diagnosis not present

## 2020-01-01 DIAGNOSIS — R278 Other lack of coordination: Secondary | ICD-10-CM | POA: Diagnosis not present

## 2020-01-01 DIAGNOSIS — R262 Difficulty in walking, not elsewhere classified: Secondary | ICD-10-CM | POA: Diagnosis not present

## 2020-01-01 DIAGNOSIS — M6281 Muscle weakness (generalized): Secondary | ICD-10-CM | POA: Diagnosis not present

## 2020-01-08 DIAGNOSIS — M6281 Muscle weakness (generalized): Secondary | ICD-10-CM | POA: Diagnosis not present

## 2020-01-08 DIAGNOSIS — R262 Difficulty in walking, not elsewhere classified: Secondary | ICD-10-CM | POA: Diagnosis not present

## 2020-01-08 DIAGNOSIS — R278 Other lack of coordination: Secondary | ICD-10-CM | POA: Diagnosis not present

## 2020-01-13 DIAGNOSIS — M6281 Muscle weakness (generalized): Secondary | ICD-10-CM | POA: Diagnosis not present

## 2020-01-13 DIAGNOSIS — R262 Difficulty in walking, not elsewhere classified: Secondary | ICD-10-CM | POA: Diagnosis not present

## 2020-01-13 DIAGNOSIS — R278 Other lack of coordination: Secondary | ICD-10-CM | POA: Diagnosis not present

## 2020-01-15 DIAGNOSIS — R278 Other lack of coordination: Secondary | ICD-10-CM | POA: Diagnosis not present

## 2020-01-15 DIAGNOSIS — R262 Difficulty in walking, not elsewhere classified: Secondary | ICD-10-CM | POA: Diagnosis not present

## 2020-01-15 DIAGNOSIS — M6281 Muscle weakness (generalized): Secondary | ICD-10-CM | POA: Diagnosis not present

## 2020-01-20 DIAGNOSIS — R262 Difficulty in walking, not elsewhere classified: Secondary | ICD-10-CM | POA: Diagnosis not present

## 2020-01-20 DIAGNOSIS — R278 Other lack of coordination: Secondary | ICD-10-CM | POA: Diagnosis not present

## 2020-01-20 DIAGNOSIS — M6281 Muscle weakness (generalized): Secondary | ICD-10-CM | POA: Diagnosis not present

## 2020-01-22 DIAGNOSIS — M6281 Muscle weakness (generalized): Secondary | ICD-10-CM | POA: Diagnosis not present

## 2020-01-22 DIAGNOSIS — R262 Difficulty in walking, not elsewhere classified: Secondary | ICD-10-CM | POA: Diagnosis not present

## 2020-01-22 DIAGNOSIS — R278 Other lack of coordination: Secondary | ICD-10-CM | POA: Diagnosis not present

## 2020-01-27 DIAGNOSIS — R262 Difficulty in walking, not elsewhere classified: Secondary | ICD-10-CM | POA: Diagnosis not present

## 2020-01-27 DIAGNOSIS — R278 Other lack of coordination: Secondary | ICD-10-CM | POA: Diagnosis not present

## 2020-01-27 DIAGNOSIS — M6281 Muscle weakness (generalized): Secondary | ICD-10-CM | POA: Diagnosis not present

## 2020-01-29 DIAGNOSIS — R278 Other lack of coordination: Secondary | ICD-10-CM | POA: Diagnosis not present

## 2020-01-29 DIAGNOSIS — R262 Difficulty in walking, not elsewhere classified: Secondary | ICD-10-CM | POA: Diagnosis not present

## 2020-01-29 DIAGNOSIS — M6281 Muscle weakness (generalized): Secondary | ICD-10-CM | POA: Diagnosis not present

## 2020-02-03 DIAGNOSIS — M6281 Muscle weakness (generalized): Secondary | ICD-10-CM | POA: Diagnosis not present

## 2020-02-03 DIAGNOSIS — R262 Difficulty in walking, not elsewhere classified: Secondary | ICD-10-CM | POA: Diagnosis not present

## 2020-02-03 DIAGNOSIS — R278 Other lack of coordination: Secondary | ICD-10-CM | POA: Diagnosis not present

## 2020-02-05 DIAGNOSIS — R278 Other lack of coordination: Secondary | ICD-10-CM | POA: Diagnosis not present

## 2020-02-05 DIAGNOSIS — R262 Difficulty in walking, not elsewhere classified: Secondary | ICD-10-CM | POA: Diagnosis not present

## 2020-02-05 DIAGNOSIS — M6281 Muscle weakness (generalized): Secondary | ICD-10-CM | POA: Diagnosis not present

## 2020-02-10 DIAGNOSIS — R262 Difficulty in walking, not elsewhere classified: Secondary | ICD-10-CM | POA: Diagnosis not present

## 2020-02-10 DIAGNOSIS — R278 Other lack of coordination: Secondary | ICD-10-CM | POA: Diagnosis not present

## 2020-02-10 DIAGNOSIS — M6281 Muscle weakness (generalized): Secondary | ICD-10-CM | POA: Diagnosis not present

## 2020-02-12 DIAGNOSIS — R262 Difficulty in walking, not elsewhere classified: Secondary | ICD-10-CM | POA: Diagnosis not present

## 2020-02-12 DIAGNOSIS — R278 Other lack of coordination: Secondary | ICD-10-CM | POA: Diagnosis not present

## 2020-02-12 DIAGNOSIS — M6281 Muscle weakness (generalized): Secondary | ICD-10-CM | POA: Diagnosis not present

## 2020-02-17 DIAGNOSIS — R278 Other lack of coordination: Secondary | ICD-10-CM | POA: Diagnosis not present

## 2020-02-17 DIAGNOSIS — R262 Difficulty in walking, not elsewhere classified: Secondary | ICD-10-CM | POA: Diagnosis not present

## 2020-02-17 DIAGNOSIS — M6281 Muscle weakness (generalized): Secondary | ICD-10-CM | POA: Diagnosis not present

## 2020-02-19 DIAGNOSIS — R278 Other lack of coordination: Secondary | ICD-10-CM | POA: Diagnosis not present

## 2020-02-19 DIAGNOSIS — M6281 Muscle weakness (generalized): Secondary | ICD-10-CM | POA: Diagnosis not present

## 2020-02-19 DIAGNOSIS — R262 Difficulty in walking, not elsewhere classified: Secondary | ICD-10-CM | POA: Diagnosis not present

## 2020-05-21 ENCOUNTER — Other Ambulatory Visit: Payer: Self-pay | Admitting: Internal Medicine

## 2020-05-25 ENCOUNTER — Encounter: Payer: Medicare Other | Admitting: Internal Medicine

## 2020-05-28 ENCOUNTER — Encounter: Payer: Medicare Other | Admitting: Internal Medicine

## 2020-06-02 ENCOUNTER — Other Ambulatory Visit: Payer: Self-pay

## 2020-06-02 ENCOUNTER — Encounter: Payer: Self-pay | Admitting: Internal Medicine

## 2020-06-02 ENCOUNTER — Ambulatory Visit (INDEPENDENT_AMBULATORY_CARE_PROVIDER_SITE_OTHER): Payer: Medicare Other | Admitting: Internal Medicine

## 2020-06-02 VITALS — BP 138/78 | HR 60 | Temp 97.2°F | Ht 68.0 in | Wt 144.0 lb

## 2020-06-02 DIAGNOSIS — Z0000 Encounter for general adult medical examination without abnormal findings: Secondary | ICD-10-CM

## 2020-06-02 DIAGNOSIS — Z7189 Other specified counseling: Secondary | ICD-10-CM

## 2020-06-02 DIAGNOSIS — D692 Other nonthrombocytopenic purpura: Secondary | ICD-10-CM | POA: Diagnosis not present

## 2020-06-02 DIAGNOSIS — E441 Mild protein-calorie malnutrition: Secondary | ICD-10-CM

## 2020-06-02 DIAGNOSIS — E039 Hypothyroidism, unspecified: Secondary | ICD-10-CM

## 2020-06-02 DIAGNOSIS — N400 Enlarged prostate without lower urinary tract symptoms: Secondary | ICD-10-CM

## 2020-06-02 LAB — COMPREHENSIVE METABOLIC PANEL
ALT: 16 U/L (ref 0–53)
AST: 23 U/L (ref 0–37)
Albumin: 3.9 g/dL (ref 3.5–5.2)
Alkaline Phosphatase: 118 U/L — ABNORMAL HIGH (ref 39–117)
BUN: 18 mg/dL (ref 6–23)
CO2: 33 mEq/L — ABNORMAL HIGH (ref 19–32)
Calcium: 10.4 mg/dL (ref 8.4–10.5)
Chloride: 103 mEq/L (ref 96–112)
Creatinine, Ser: 0.81 mg/dL (ref 0.40–1.50)
GFR: 88.83 mL/min (ref >60.00)
Glucose, Bld: 87 mg/dL (ref 70–99)
Potassium: 4.5 mEq/L (ref 3.5–5.1)
Sodium: 138 mEq/L (ref 135–145)
Total Bilirubin: 0.7 mg/dL (ref 0.2–1.2)
Total Protein: 6.9 g/dL (ref 6.0–8.3)

## 2020-06-02 LAB — T4, FREE: Free T4: 1.02 ng/dL (ref 0.60–1.60)

## 2020-06-02 LAB — TSH: TSH: 0.43 u[IU]/mL (ref 0.35–4.50)

## 2020-06-02 LAB — CBC
HCT: 38.1 % — ABNORMAL LOW (ref 39.0–52.0)
Hemoglobin: 12.8 g/dL — ABNORMAL LOW (ref 13.0–17.0)
MCHC: 33.6 g/dL (ref 30.0–36.0)
MCV: 96.1 fl (ref 78.0–100.0)
Platelets: 178 10*3/uL (ref 150.0–400.0)
RBC: 3.97 Mil/uL — ABNORMAL LOW (ref 4.22–5.81)
RDW: 14.4 % (ref 11.5–15.5)
WBC: 4.9 10*3/uL (ref 4.0–10.5)

## 2020-06-02 LAB — SEDIMENTATION RATE: Sed Rate: 12 mm/hr (ref 0–20)

## 2020-06-02 NOTE — Progress Notes (Signed)
Hearing Screening   125Hz  250Hz  500Hz  1000Hz  2000Hz  3000Hz  4000Hz  6000Hz  8000Hz   Right ear:           Left ear:           Comments: Has hearing aids. Not wearing them today.  Vision Screening Comments: January 2020

## 2020-06-02 NOTE — Assessment & Plan Note (Signed)
Stable easy bruising No ulcers or abnormal bleeding Will check CBC

## 2020-06-02 NOTE — Assessment & Plan Note (Signed)
Has lost more despite good appetite Discussed increasing ensure to 2 per day and more calorie rich food (like ice cream) Would consider mirtazapine if continues to lose weight

## 2020-06-02 NOTE — Assessment & Plan Note (Signed)
Mild symptoms only No Rx needed

## 2020-06-02 NOTE — Progress Notes (Addendum)
Subjective:    Patient ID: Adam Bentley, male    DOB: [birth year], 86 y.o.   MRN: [id number]  HPI Here with niece [name] for Medicare wellness visit and follow up of chronic health conditions This visit occurred during the SARS-CoV-2 public health emergency.  Safety protocols were in place, including screening questions prior to the visit, additional usage of staff PPE, and extensive cleaning of exam room while observing appropriate contact time as indicated for disinfecting solutions.   Reviewed advanced directives Reviewed other doctors----[facility]--for audiologist Does do some exercise--recent PT [place] once this year--no injury (usually walks with rollator) No hospitalizations or surgery in the past year No tobacco No alcohol Vision is okay---cataracts done before COVID Has hearing aides--they help No sig depression --not anhedonic Independent with instrumental ADLs---[place] has weekly housekeeping Has dog--scoops poop, etc No sig memory issues  Has lost more weight--down another 11# since last year Eats well per niece Uses ensure --1 can daily (as snack) Stopped ice cream and changed to icicles (asked him to go back)  Ongoing easy bruising Usually has discoloration on ams, etc No abnormal bleeding  Energy levels seem okay No sig fatigue No change in hair or skin  Voids okay Nocturia ---uses urinal  Current Outpatient Medications on File Prior to Visit  Medication Sig Dispense Refill  . cyanocobalamin 1000 MCG tablet Take 5,000 mcg by mouth daily.     Marland Kitchen levothyroxine (SYNTHROID) 100 MCG tablet TAKE 1 TABLET BY MOUTH EVERY DAY 90 tablet 0  . Multiple Vitamins-Minerals (OCUVITE PO) Take by mouth.     No current facility-administered medications on file prior to visit.    No Known Allergies  Past Medical History:  Diagnosis Date  . BPH (benign prostatic hypertrophy)   . Dental bridge present    permanent upper  . Diplopia 1986   concussion  . Hearing aid worn    bilateral  . History of colon cancer   . Hyperlipidemia   . Hypothyroidism   . Thyroid disease   . Wears dentures    full lower    Past Surgical History:  Procedure Laterality Date  . CATARACT EXTRACTION W/PHACO Left 08/21/2017   Procedure: CATARACT EXTRACTION PHACO AND INTRAOCULAR LENS PLACEMENT ([place]) LEFT;  Surgeon: Eulogio Bear, MD;  Location: [facility];  Service: Ophthalmology;  Laterality: Left;  . CATARACT EXTRACTION W/PHACO Right 09/10/2017   Procedure: CATARACT EXTRACTION PHACO AND INTRAOCULAR LENS PLACEMENT ([place]) RIGHT;  Surgeon: Eulogio Bear, MD;  Location: [facility];  Service: Ophthalmology;  Laterality: Right;  . HERNIA REPAIR    . PARTIAL COLECTOMY  03/1999   ([place]) chemo with Choksi  . TURP [place]    History reviewed. No pertinent family history.  Social History   Socioeconomic History  . Marital status: Single    Spouse name: Not on file  . Number of children: Not on file  . Years of education: Not on file  . Highest education level: Not on file  Occupational History  . 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 Smoking status: Never Smoker  . Smokeless tobacco: Never Used  Substance and Sexual Activity  . Alcohol use: Yes    Comment: 2 beers/mo  . Drug use: No  . Sexual activity: Not on file  Other Topics Concern  . Not on file  Social History Narrative   Has living will    Requests  niece [name] (or nephew Delfino Lovett) as health care power of attorney   Has DNR already from me   No tube feeds if cognitively unaware (unless clearly seems reversible)   Social Determinants of Radio broadcast assistant Strain:   . Difficulty of Paying Living Expenses:   Food Insecurity:   . Worried About Charity fundraiser in the Last Year:   . Arboriculturist in the Last Year:   Transportation Needs:   . Film/video editor (Medical):     Marland Kitchen Lack of Transportation (Non-Medical):   Physical Activity:   . Days of Exercise per Week:   . Minutes of Exercise per Session:   Stress:   . Feeling of Stress :   Social Connections:   . Frequency of Communication with Friends and Family:   . Frequency of Social Gatherings with Friends and Family:   . Attends Religious Services:   . Active Member of Clubs or Organizations:   . Attends Archivist Meetings:   Marland Kitchen Marital Status:   Intimate Partner Violence:   . Fear of Current or Ex-Partner:   . Emotionally Abused:   Marland Kitchen Physically Abused:   . Sexually Abused:    Review of Systems Sleeps well Wears seat belt Having trouble with his bridge--needs new dentist No suspicious skin lesions now--had Moh's surgery at [facility] about 3 years ago No chest pain or SOB No dizziness or syncope---has to "take my time" Bowels are fine---no blood No heartburn or dysphagia No sig back or joint pains----back pain better after PT    Objective:   Physical Exam  Constitutional: He is oriented to person, place, and time. He appears well-developed. No distress.  HENT:  Mouth/Throat: Oropharynx is clear and moist.  No oral lesions  Neck: No thyromegaly present.  Cardiovascular: Normal rate, regular rhythm and normal heart sounds. Exam reveals no gallop.  No murmur heard. Feet warm but no palpable pulses  Respiratory: Effort normal and breath sounds normal. No respiratory distress. He has no wheezes. He has no rales.  GI: Soft. There is no abdominal tenderness.  Musculoskeletal:        General: No tenderness or edema.  Lymphadenopathy:    He has no cervical adenopathy.  Neurological: He is alert and oriented to person, place, and time.  President--- "Zoila Shutter, the younger [place], [place]..." [id number] Recall 2/3  Skin: No rash noted.  Widespread ecchymotic areas---mostly on arms  Psychiatric: He has a normal mood and affect. His behavior is normal.            Assessment & Plan:

## 2020-06-02 NOTE — Assessment & Plan Note (Addendum)
Other than weight loss---seems euthyroid Continue current levothyroxine Will check labs

## 2020-06-02 NOTE — Assessment & Plan Note (Signed)
Has DNR 

## 2020-06-02 NOTE — Assessment & Plan Note (Signed)
I have personally reviewed the Medicare Annual Wellness questionnaire and have noted 1. The patient's medical and social history 2. Their use of alcohol, tobacco or illicit drugs 3. Their current medications and supplements 4. The patient's functional ability including ADL's, fall risks, home safety risks and hearing or visual             impairment. 5. Diet and physical activities 6. Evidence for depression or mood disorders  The patients weight, height, BMI and visual acuity have been recorded in the chart I have made referrals, counseling and provided education to the patient based review of the above and I have provided the pt with a written personalized care plan for preventive services.  I have provided you with a copy of your personalized plan for preventive services. Please take the time to review along with your updated medication list.  No cancer screening due to age Flu vaccine in the fall Td booster if any injury Had COVID vaccine

## 2020-08-24 ENCOUNTER — Other Ambulatory Visit: Payer: Self-pay | Admitting: Internal Medicine

## 2020-10-02 DIAGNOSIS — Z23 Encounter for immunization: Secondary | ICD-10-CM | POA: Diagnosis not present

## 2020-11-12 DIAGNOSIS — Z23 Encounter for immunization: Secondary | ICD-10-CM | POA: Diagnosis not present

## 2021-01-10 ENCOUNTER — Inpatient Hospital Stay
Admission: EM | Admit: 2021-01-10 | Discharge: 2021-01-18 | DRG: 291 | Disposition: A | Discharge status: Nursing Home (Includes ICF) | Payer: Medicare Other | Attending: Internal Medicine | Admitting: Internal Medicine

## 2021-01-10 ENCOUNTER — Emergency Department: Payer: Medicare Other

## 2021-01-10 ENCOUNTER — Other Ambulatory Visit: Payer: Self-pay

## 2021-01-10 ENCOUNTER — Encounter: Payer: Self-pay | Admitting: Internal Medicine

## 2021-01-10 DIAGNOSIS — R531 Weakness: Secondary | ICD-10-CM | POA: Diagnosis not present

## 2021-01-10 DIAGNOSIS — J61 Pneumoconiosis due to asbestos and other mineral fibers: Secondary | ICD-10-CM | POA: Diagnosis present

## 2021-01-10 DIAGNOSIS — R0602 Shortness of breath: Secondary | ICD-10-CM | POA: Diagnosis not present

## 2021-01-10 DIAGNOSIS — H9190 Unspecified hearing loss, unspecified ear: Secondary | ICD-10-CM | POA: Diagnosis present

## 2021-01-10 DIAGNOSIS — Z8249 Family history of ischemic heart disease and other diseases of the circulatory system: Secondary | ICD-10-CM | POA: Diagnosis not present

## 2021-01-10 DIAGNOSIS — Z974 Presence of external hearing-aid: Secondary | ICD-10-CM

## 2021-01-10 DIAGNOSIS — Z9841 Cataract extraction status, right eye: Secondary | ICD-10-CM | POA: Diagnosis not present

## 2021-01-10 DIAGNOSIS — Z87891 Personal history of nicotine dependence: Secondary | ICD-10-CM | POA: Diagnosis not present

## 2021-01-10 DIAGNOSIS — Z79899 Other long term (current) drug therapy: Secondary | ICD-10-CM | POA: Diagnosis not present

## 2021-01-10 DIAGNOSIS — Z20822 Contact with and (suspected) exposure to covid-19: Secondary | ICD-10-CM | POA: Diagnosis present

## 2021-01-10 DIAGNOSIS — M6281 Muscle weakness (generalized): Secondary | ICD-10-CM | POA: Diagnosis not present

## 2021-01-10 DIAGNOSIS — I5021 Acute systolic (congestive) heart failure: Secondary | ICD-10-CM | POA: Diagnosis not present

## 2021-01-10 DIAGNOSIS — J90 Pleural effusion, not elsewhere classified: Secondary | ICD-10-CM | POA: Diagnosis not present

## 2021-01-10 DIAGNOSIS — Z515 Encounter for palliative care: Secondary | ICD-10-CM | POA: Diagnosis not present

## 2021-01-10 DIAGNOSIS — E785 Hyperlipidemia, unspecified: Secondary | ICD-10-CM | POA: Diagnosis present

## 2021-01-10 DIAGNOSIS — R0902 Hypoxemia: Secondary | ICD-10-CM

## 2021-01-10 DIAGNOSIS — Z139 Encounter for screening, unspecified: Secondary | ICD-10-CM

## 2021-01-10 DIAGNOSIS — Z7189 Other specified counseling: Secondary | ICD-10-CM

## 2021-01-10 DIAGNOSIS — Z85038 Personal history of other malignant neoplasm of large intestine: Secondary | ICD-10-CM | POA: Diagnosis not present

## 2021-01-10 DIAGNOSIS — I255 Ischemic cardiomyopathy: Secondary | ICD-10-CM | POA: Diagnosis present

## 2021-01-10 DIAGNOSIS — R778 Other specified abnormalities of plasma proteins: Secondary | ICD-10-CM | POA: Diagnosis not present

## 2021-01-10 DIAGNOSIS — N400 Enlarged prostate without lower urinary tract symptoms: Secondary | ICD-10-CM | POA: Diagnosis present

## 2021-01-10 DIAGNOSIS — E039 Hypothyroidism, unspecified: Secondary | ICD-10-CM | POA: Diagnosis present

## 2021-01-10 DIAGNOSIS — I712 Thoracic aortic aneurysm, without rupture: Secondary | ICD-10-CM | POA: Diagnosis not present

## 2021-01-10 DIAGNOSIS — I428 Other cardiomyopathies: Secondary | ICD-10-CM | POA: Diagnosis present

## 2021-01-10 DIAGNOSIS — I509 Heart failure, unspecified: Secondary | ICD-10-CM

## 2021-01-10 DIAGNOSIS — J189 Pneumonia, unspecified organism: Secondary | ICD-10-CM | POA: Diagnosis not present

## 2021-01-10 DIAGNOSIS — R5381 Other malaise: Secondary | ICD-10-CM | POA: Diagnosis not present

## 2021-01-10 DIAGNOSIS — H532 Diplopia: Secondary | ICD-10-CM | POA: Diagnosis present

## 2021-01-10 DIAGNOSIS — R000 Tachycardia, unspecified: Secondary | ICD-10-CM | POA: Diagnosis not present

## 2021-01-10 DIAGNOSIS — Z9842 Cataract extraction status, left eye: Secondary | ICD-10-CM

## 2021-01-10 DIAGNOSIS — Z79890 Hormone replacement therapy (postmenopausal): Secondary | ICD-10-CM | POA: Diagnosis not present

## 2021-01-10 DIAGNOSIS — Z66 Do not resuscitate: Secondary | ICD-10-CM | POA: Diagnosis not present

## 2021-01-10 DIAGNOSIS — I5043 Acute on chronic combined systolic (congestive) and diastolic (congestive) heart failure: Principal | ICD-10-CM | POA: Diagnosis present

## 2021-01-10 DIAGNOSIS — R001 Bradycardia, unspecified: Secondary | ICD-10-CM | POA: Diagnosis not present

## 2021-01-10 DIAGNOSIS — R0689 Other abnormalities of breathing: Secondary | ICD-10-CM | POA: Diagnosis not present

## 2021-01-10 DIAGNOSIS — I7121 Aneurysm of the ascending aorta, without rupture: Secondary | ICD-10-CM | POA: Diagnosis present

## 2021-01-10 DIAGNOSIS — R7989 Other specified abnormal findings of blood chemistry: Secondary | ICD-10-CM | POA: Diagnosis present

## 2021-01-10 DIAGNOSIS — Z961 Presence of intraocular lens: Secondary | ICD-10-CM | POA: Diagnosis present

## 2021-01-10 DIAGNOSIS — R279 Unspecified lack of coordination: Secondary | ICD-10-CM | POA: Diagnosis not present

## 2021-01-10 DIAGNOSIS — I248 Other forms of acute ischemic heart disease: Secondary | ICD-10-CM | POA: Diagnosis not present

## 2021-01-10 DIAGNOSIS — J9601 Acute respiratory failure with hypoxia: Secondary | ICD-10-CM | POA: Diagnosis not present

## 2021-01-10 DIAGNOSIS — R069 Unspecified abnormalities of breathing: Secondary | ICD-10-CM | POA: Diagnosis not present

## 2021-01-10 DIAGNOSIS — I2699 Other pulmonary embolism without acute cor pulmonale: Secondary | ICD-10-CM | POA: Diagnosis not present

## 2021-01-10 DIAGNOSIS — I517 Cardiomegaly: Secondary | ICD-10-CM | POA: Diagnosis not present

## 2021-01-10 LAB — CBC
HCT: 40.2 % (ref 39.0–52.0)
Hemoglobin: 12.9 g/dL — ABNORMAL LOW (ref 13.0–17.0)
MCH: 32.7 pg (ref 26.0–34.0)
MCHC: 32.1 g/dL (ref 30.0–36.0)
MCV: 101.8 fL — ABNORMAL HIGH (ref 80.0–100.0)
Platelets: 229 10*3/uL (ref 150–400)
RBC: 3.95 MIL/uL — ABNORMAL LOW (ref 4.22–5.81)
RDW: 14.5 % (ref 11.5–15.5)
WBC: 6.3 10*3/uL (ref 4.0–10.5)
nRBC: 0 % (ref 0.0–0.2)

## 2021-01-10 LAB — TROPONIN I (HIGH SENSITIVITY)
Troponin I (High Sensitivity): 213 ng/L (ref <18)
Troponin I (High Sensitivity): 235 ng/L (ref <18)

## 2021-01-10 LAB — RESP PANEL BY RT-PCR (FLU A&B, COVID) ARPGX2
Influenza A by PCR: NEGATIVE
Influenza B by PCR: NEGATIVE
SARS Coronavirus 2 by RT PCR: NEGATIVE

## 2021-01-10 LAB — BASIC METABOLIC PANEL
Anion gap: 12 (ref 5–15)
BUN: 32 mg/dL — ABNORMAL HIGH (ref 8–23)
CO2: 27 mmol/L (ref 22–32)
Calcium: 10.4 mg/dL — ABNORMAL HIGH (ref 8.9–10.3)
Chloride: 105 mmol/L (ref 98–111)
Creatinine, Ser: 0.98 mg/dL (ref 0.61–1.24)
GFR, Estimated: >60 mL/min (ref >60)
Glucose, Bld: 100 mg/dL — ABNORMAL HIGH (ref 70–99)
Potassium: 4.2 mmol/L (ref 3.5–5.1)
Sodium: 144 mmol/L (ref 135–145)

## 2021-01-10 LAB — BRAIN NATRIURETIC PEPTIDE: B Natriuretic Peptide: 1927.9 pg/mL — ABNORMAL HIGH (ref 0.0–100.0)

## 2021-01-10 MED ORDER — ATORVASTATIN CALCIUM 20 MG PO TABS
40.0000 mg | ORAL_TABLET | Freq: Every day | ORAL | Status: DC
  Administered 2021-01-10 – 2021-01-18 (×9): 40 mg via ORAL
  Filled 2021-01-10 (×9): qty 2

## 2021-01-10 MED ORDER — IOHEXOL 350 MG/ML SOLN
75.0000 mL | Freq: Once | INTRAVENOUS | Status: AC | PRN
  Administered 2021-01-10: 75 mL via INTRAVENOUS

## 2021-01-10 MED ORDER — ONDANSETRON HCL 4 MG/2ML IJ SOLN: 4.0000 mg | Freq: Three times a day (TID) | INTRAMUSCULAR | Status: DC | PRN

## 2021-01-10 MED ORDER — FUROSEMIDE 10 MG/ML IJ SOLN
20.0000 mg | Freq: Once | INTRAMUSCULAR | Status: DC
  Filled 2021-01-10: qty 4

## 2021-01-10 MED ORDER — SODIUM CHLORIDE 0.9 % IV SOLN: 250.0000 mL | INTRAVENOUS | Status: DC | PRN

## 2021-01-10 MED ORDER — FUROSEMIDE 10 MG/ML IJ SOLN
20.0000 mg | Freq: Two times a day (BID) | INTRAMUSCULAR | Status: DC
  Administered 2021-01-10 – 2021-01-13 (×6): 20 mg via INTRAVENOUS
  Filled 2021-01-10 (×2): qty 4
  Filled 2021-01-10: qty 2
  Filled 2021-01-10 (×2): qty 4

## 2021-01-10 MED ORDER — VITAMIN B-12 1000 MCG PO TABS
1000.0000 ug | ORAL_TABLET | Freq: Every day | ORAL | Status: DC
  Administered 2021-01-12 – 2021-01-18 (×7): 1000 ug via ORAL
  Filled 2021-01-10 (×8): qty 1

## 2021-01-10 MED ORDER — DM-GUAIFENESIN ER 30-600 MG PO TB12: 1.0000 | ORAL_TABLET | Freq: Two times a day (BID) | ORAL | Status: DC | PRN

## 2021-01-10 MED ORDER — SODIUM CHLORIDE 0.9% FLUSH: 3.0000 mL | INTRAVENOUS | Status: DC | PRN

## 2021-01-10 MED ORDER — ENOXAPARIN SODIUM 40 MG/0.4ML ~~LOC~~ SOLN
40.0000 mg | SUBCUTANEOUS | Status: DC
  Administered 2021-01-11 – 2021-01-18 (×8): 40 mg via SUBCUTANEOUS
  Filled 2021-01-10 (×8): qty 0.4

## 2021-01-10 MED ORDER — ACETAMINOPHEN 325 MG PO TABS: 650.0000 mg | ORAL_TABLET | Freq: Four times a day (QID) | ORAL | Status: DC | PRN

## 2021-01-10 MED ORDER — LEVOTHYROXINE SODIUM 100 MCG PO TABS
100.0000 ug | ORAL_TABLET | Freq: Every day | ORAL | Status: DC
  Administered 2021-01-12 – 2021-01-18 (×7): 100 ug via ORAL
  Filled 2021-01-10 (×7): qty 1

## 2021-01-10 MED ORDER — ASPIRIN EC 81 MG PO TBEC
81.0000 mg | DELAYED_RELEASE_TABLET | Freq: Every day | ORAL | Status: DC
  Administered 2021-01-12 – 2021-01-18 (×7): 81 mg via ORAL
  Filled 2021-01-10 (×7): qty 1

## 2021-01-10 MED ORDER — ALBUTEROL SULFATE HFA 108 (90 BASE) MCG/ACT IN AERS
2.0000 | INHALATION_SPRAY | RESPIRATORY_TRACT | Status: DC | PRN
  Filled 2021-01-10: qty 6.7

## 2021-01-10 MED ORDER — SODIUM CHLORIDE 0.9% FLUSH
3.0000 mL | Freq: Two times a day (BID) | INTRAVENOUS | Status: DC
  Administered 2021-01-11 – 2021-01-18 (×10): 3 mL via INTRAVENOUS

## 2021-01-10 MED ORDER — FUROSEMIDE 10 MG/ML IJ SOLN: 40.0000 mg | Freq: Once | INTRAMUSCULAR | Status: DC

## 2021-01-10 MED ORDER — APOAEQUORIN 10 MG PO CAPS: 1.0000 | ORAL_CAPSULE | Freq: Every day | ORAL | Status: DC

## 2021-01-10 MED ORDER — LISINOPRIL 5 MG PO TABS
2.5000 mg | ORAL_TABLET | Freq: Every day | ORAL | Status: DC
  Administered 2021-01-11 – 2021-01-18 (×8): 2.5 mg via ORAL
  Filled 2021-01-10 (×8): qty 1

## 2021-01-10 NOTE — ED Notes (Signed)
Pt resting in poc on edge of bed, alert and oritented x4, c/o sob at home prior to arrival and dyspnea with exertion.  No acute s/s of distress at this time, lung fields have slight rhonchi bilaterally, otherwise clear, abd soft/flat/nontender, skin warm/pink/dry,m, +pmsc x4, VSS. Given regular diet tray per orders will continue to monitor/reassess.

## 2021-01-10 NOTE — ED Provider Notes (Signed)
[facility] Emergency Department Provider Note    Event Date/Time   First MD Initiated Contact with Patient 01/10/21 1134     (approximate)  I have reviewed the triage vital signs and the nursing notes.   HISTORY  Chief Complaint Shortness of Breath    HPI Adam Bentley is a 87 y.o. male with the below listed past medical history presents to the ER for 3 days of generalized malaise has had a nonproductive cough and some congestion.  Called EMS this morning after speaking with his daughter on the phone who encouraged her to be seen.  EMS reportedly had low O2 saturations with the patient but on arrival to the ER he is satting 99% on room air.  Does not feel short of breath.  Denies any chest pain or pressure.  No nausea or vomiting.  No measured fevers.  States he otherwise feels well    Past Medical History:  Diagnosis Date  . BPH (benign prostatic hypertrophy)   . Dental bridge present    permanent upper  . Diplopia 1986   concussion  . Hearing aid worn    bilateral  . History of colon cancer   . Hyperlipidemia   . Hypothyroidism   . Thyroid disease   . Wears dentures    full lower   No family history on file. Past Surgical History:  Procedure Laterality Date  . CATARACT EXTRACTION W/PHACO Left 08/21/2017   Procedure: CATARACT EXTRACTION PHACO AND INTRAOCULAR LENS PLACEMENT ([place]) LEFT;  Surgeon: Eulogio Bear, MD;  Location: [place];  Service: Ophthalmology;  Laterality: Left;  . CATARACT EXTRACTION W/PHACO Right 09/10/2017   Procedure: CATARACT EXTRACTION PHACO AND INTRAOCULAR LENS PLACEMENT ([place]) RIGHT;  Surgeon: Eulogio Bear, MD;  Location: [facility];  Service: Ophthalmology;  Laterality: Right;  . HERNIA REPAIR    . PARTIAL COLECTOMY  03/1999   ([place]) chemo with Choksi  . TURP VAPORIZATION  1993   Patient Active Problem List   Diagnosis Date Noted  . Acute CHF (congestive heart failure) ([place])  01/10/2021  . HLD (hyperlipidemia) 01/10/2021  . Acute respiratory failure with hypoxia ([place]) 01/10/2021  . Ascending aortic aneurysm ([place]) 01/10/2021  . Malnutrition of mild degree ([place]) 12/24/2018  . Senile purpura ([place]) 05/22/2018  . 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 Advance directive discussed with patient 04/19/2015  . Routine general medical examination at a health care facility 04/16/2013  . HEARING LOSS 01/29/2009  . Hypothyroidism 05/17/2007  . BPH without obstruction/lower urinary tract symptoms 05/17/2007  . COLON CANCER, HX OF 05/17/2007      Prior to Admission medications   Medication Sig Start Date End Date Taking? Authorizing Provider  Apoaequorin (PREVAGEN PO) Take 1 tablet by mouth daily.   Yes [provider]  cyanocobalamin 1000 MCG tablet Take 1,000 mcg by mouth daily.   Yes [provider]  levothyroxine (SYNTHROID) 100 MCG tablet TAKE 1 TABLET BY MOUTH EVERY DAY 08/25/20  Yes Venia Carbon, MD  Multiple Vitamins-Minerals (OCUVITE PO) Take 1 tablet by mouth daily.   Yes [provider]    Allergies Patient has no known allergies.    Social History Social History   Tobacco Use  . Smoking status: Never Smoker  . Smokeless tobacco: Never Used  Substance Use Topics  . Alcohol use: Yes    Comment: 2 beers/mo  . Drug use: No    Review of Systems Patient denies headaches, rhinorrhea, blurry vision,  numbness, shortness of breath, chest pain, edema, cough, abdominal pain, nausea, vomiting, diarrhea, dysuria, fevers, rashes or hallucinations unless otherwise stated above in HPI. ____________________________________________   PHYSICAL EXAM:  VITAL SIGNS: Vitals:   01/10/21 1400 01/10/21 1430  BP:  138/88  Pulse: 78 79  Resp:  18  Temp:    SpO2: 95% 95%    Constitutional: Alert and oriented.  Eyes: Conjunctivae are normal.  Head: Atraumatic. Nose: No congestion/rhinnorhea. Mouth/Throat: Mucous membranes are moist.    Neck: No stridor. Painless ROM.  Cardiovascular: Normal rate, regular rhythm. Grossly normal heart sounds.  Good peripheral circulation. Respiratory: Normal respiratory effort.  No retractions. Lungs CTAB. Gastrointestinal: Soft and nontender. No distention. No abdominal bruits. No CVA tenderness. Genitourinary:  Musculoskeletal: No lower extremity tenderness nor edema.  No joint effusions. Neurologic:  Normal speech and language. No gross focal neurologic deficits are appreciated. No facial droop Skin:  Skin is warm, dry and intact. No rash noted. Psychiatric: Mood and affect are normal. Speech and behavior are normal.  ____________________________________________   LABS (all labs ordered are listed, but only abnormal results are displayed)  Results for orders placed or performed during the hospital encounter of 01/10/21 (from the past 24 hour(s))  Brain natriuretic peptide     Status: Abnormal   Collection Time: 01/10/21 11:35 AM  Result Value Ref Range   B Natriuretic Peptide 1,927.9 (H) 0.0 - 100.0 pg/mL  Basic metabolic panel     Status: Abnormal   Collection Time: 01/10/21 11:36 AM  Result Value Ref Range   Sodium 144 135 - 145 mmol/L   Potassium 4.2 3.5 - 5.1 mmol/L   Chloride 105 98 - 111 mmol/L   CO2 27 22 - 32 mmol/L   Glucose, Bld 100 (H) 70 - 99 mg/dL   BUN 32 (H) 8 - 23 mg/dL   Creatinine, Ser 0.98 0.61 - 1.24 mg/dL   Calcium 10.4 (H) 8.9 - 10.3 mg/dL   GFR, Estimated >60 >60 mL/min   Anion gap 12 5 - 15  CBC     Status: Abnormal   Collection Time: 01/10/21 11:36 AM  Result Value Ref Range   WBC 6.3 4.0 - 10.5 K/uL   RBC 3.95 (L) 4.22 - 5.81 MIL/uL   Hemoglobin 12.9 (L) 13.0 - 17.0 g/dL   HCT 40.2 39.0 - 52.0 %   MCV 101.8 (H) 80.0 - 100.0 fL   MCH 32.7 26.0 - 34.0 pg   MCHC 32.1 30.0 - 36.0 g/dL   RDW 14.5 11.5 - 15.5 %   Platelets 229 150 - 400 K/uL   nRBC 0.0 0.0 - 0.2 %  Resp Panel by RT-PCR (Flu A&B, Covid) Nasopharyngeal Swab     Status: None    Collection Time: 01/10/21 11:36 AM   Specimen: Nasopharyngeal Swab; Nasopharyngeal(NP) swabs in vial transport medium  Result Value Ref Range   SARS Coronavirus 2 by RT PCR NEGATIVE NEGATIVE   Influenza A by PCR NEGATIVE NEGATIVE   Influenza B by PCR NEGATIVE NEGATIVE   ____________________________________________  EKG My review and personal interpretation at Time: 15:11   Indication: sob  Rate: 80  Rhythm: sinus Axis: normal Other: nonspecific st and t wave abn, no stemi criteria ____________________________________________  RADIOLOGY  I personally reviewed all radiographic images ordered to evaluate for the above acute complaints and reviewed radiology reports and findings.  These findings were personally discussed with the patient.  Please see medical record for radiology report.  ____________________________________________   PROCEDURES  Procedure(s) performed:  Procedures    Critical Care performed: no ____________________________________________   INITIAL IMPRESSION / ASSESSMENT AND PLAN / ED COURSE  Pertinent labs & imaging results that were available during my care of the patient were reviewed by me and considered in my medical decision making (see chart for details).   DDX: bronchtiis, copd, chf, pna, ptx, anemia, sepsis, covid, pe  Adam Bentley is a 87 y.o. who presents to the ED with presentation as described above.  Patient elderly frail-appearing but nontoxic in no respiratory distress on arrival report he was hypoxic in the 52s with EMS.  States the symptoms have been progressively worsening over the past several days and weeks.  Denies any measured fevers.  His x-ray concerning for diffuse fibrotic changes.  No history of CHF.  On appreciating wheeze.  No fever.  Blood will be sent for the above differential.  Clinical Course as of 01/10/21 1516  Mon Jan 10, 2021  1406 Patient states that he feels well and denies any shortness of breath.  States he felt  a little bit winded with ambulation where he did desaturate but states it was not out of the ordinary.  He denies any history of interstitial lung disease we will order CTA to rule out PE and further evaluate.  Patient agreeable plan [PR]  1451 Patient scan does not show any evidence of PE but does show bilateral pulmonary edema as well as pleural effusions right greater than left findings concerning for CHF.  Given his age and hypoxia with ambulation will discuss with hospitalist for admission. [PR]    Clinical Course User Index [PR] Merlyn Lot, MD    The patient was evaluated in Emergency Department today for the symptoms described in the history of present illness. He/she was evaluated in the context of the global COVID-19 pandemic, which necessitated consideration that the patient might be at risk for infection with the SARS-CoV-2 virus that causes COVID-19. Institutional protocols and algorithms that pertain to the evaluation of patients at risk for COVID-19 are in a state of rapid change based on information released by regulatory bodies including the CDC and federal and state organizations. These policies and algorithms were followed during the patient's care in the ED.  As part of my medical decision making, I reviewed the following data within the [facility] notes reviewed and incorporated, Labs reviewed, notes from prior ED visits and [place] Controlled Substance Database   ____________________________________________   FINAL CLINICAL IMPRESSION(S) / ED DIAGNOSES  Final diagnoses:  Hypoxia  Congestive heart failure, unspecified HF chronicity, unspecified heart failure type ([place])      NEW MEDICATIONS STARTED DURING THIS VISIT:  New Prescriptions   No medications on file     Note:  This document was prepared using Dragon voice recognition software and may include unintentional dictation errors.    Merlyn Lot, MD 01/10/21 [phone]

## 2021-01-10 NOTE — H&P (Addendum)
History and Physical    Adam Bentley ERX:[id number] DOB: Mar 05, 1924 DOA: 01/10/2021  Referring MD/NP/PA:   PCP: Venia Carbon, MD   Patient coming from:  The patient is coming from SNF.  At baseline, pt is dependent for most of ADL.        Chief Complaint: SOB  HPI: Adam Bentley is a 87 y.o. male with medical history significant of hyperlipidemia, hypothyroidism, colon cancer, hearing loss, BPH, diplopia, who presents with shortness of breath.  Per her niece (I called his niece by phone), patient has been having shortness of breath for more than 3 days, which has been progressively worsening.  Patient has dry cough, no chest pain, fever or chills. Patient has orthopnea, generalized weakness. Patient does not have nausea, vomiting, diarrhea, abdominal pain, symptoms of UTI.  No facial droop or slurred speech. Patient was found to have oxygen desaturation to 78% on room air, which improved to 98% on nonrebreather.  Later improved to 93% on room air.  ED Course: pt was found to have negative COVID PCR, BNP 1927, WBC 6.3, electrolytes renal function okay, temperature normal, blood pressure 138/88, heart rate 88, RR 27, 18, chest x-ray showed interstitial edema.  CT angiogram was negative for PE, but showed cardiomegaly and bilateral interstitial edema.  Patient is admitted to progressive dialysis inpatient.  Dr. Fletcher Anon of cardiology is consulted  CTA of chest:    No evidence of pulmonary embolism.   Cardiomegaly, diffuse interstitial infiltrates, bilateral pleural effusions, suspicious for congestive heart failure.  Bilateral calcified pleural plaque, consistent with asbestos related pleural disease.  4.1 cm ascending thoracic aortic aneurysm.    Review of Systems:   General: no fevers, chills, no body weight gain, has fatigue HEENT: no blurry vision, or sore throat Respiratory: has dyspnea, coughing, no wheezing CV: no chest pain, no palpitations GI: no nausea,  vomiting, abdominal pain, diarrhea, constipation GU: no dysuria, burning on urination, increased urinary frequency, hematuria  Ext: no leg edema Neuro: no unilateral weakness, numbness, or tingling, no vision change or hearing loss Skin: no rash, no skin tear. MSK: No muscle spasm, no deformity, no limitation of range of movement in spin Heme: No easy bruising.  Travel history: No recent long distant travel.  Allergy: No Known Allergies  Past Medical History:  Diagnosis Date  . BPH (benign prostatic hypertrophy)   . Dental bridge present    permanent upper  . Diplopia 1986   concussion  . Hearing aid worn    bilateral  . History of colon cancer   . Hyperlipidemia   . Hypothyroidism   . Thyroid disease   . Wears dentures    full lower    Past Surgical History:  Procedure Laterality Date  . CATARACT EXTRACTION W/PHACO Left 08/21/2017   Procedure: CATARACT EXTRACTION PHACO AND INTRAOCULAR LENS PLACEMENT ([place]) LEFT;  Surgeon: Eulogio Bear, MD;  Location: [facility];  Service: Ophthalmology;  Laterality: Left;  . CATARACT EXTRACTION W/PHACO Right 09/10/2017   Procedure: CATARACT EXTRACTION PHACO AND INTRAOCULAR LENS PLACEMENT ([place]) RIGHT;  Surgeon: Eulogio Bear, MD;  Location: [facility];  Service: Ophthalmology;  Laterality: Right;  . HERNIA REPAIR    . PARTIAL COLECTOMY  03/1999   ([place]) chemo with [place]  . TURP VAPORIZATION  1993    Social History:  reports that he has never smoked. He has never used smokeless tobacco. He reports current alcohol use. He reports that he does not use drugs.  Family History:  Family History  Problem Relation Age of Onset  . Dementia Sister   . Lung cancer Brother      Prior to Admission medications   Medication Sig Start Date End Date Taking? Authorizing Provider  Apoaequorin (PREVAGEN PO) Take 1 tablet by mouth daily.   Yes [provider]  cyanocobalamin 1000 MCG tablet Take 1,000 mcg by mouth  daily.   Yes [provider]  levothyroxine (SYNTHROID) 100 MCG tablet TAKE 1 TABLET BY MOUTH EVERY DAY 08/25/20  Yes Venia Carbon, MD  Multiple Vitamins-Minerals (OCUVITE PO) Take 1 tablet by mouth daily.   Yes [provider]    Physical Exam: Vitals:   01/10/21 1330 01/10/21 1400 01/10/21 1430 01/10/21 1625  BP: 128/82  138/88 (!) 149/99  Pulse: 83 78 79 94  Resp: 18  18 20   Temp:      TempSrc:    Oral  SpO2: 95% 95% 95% 96%  Weight:      Height:       General: Not in acute distress HEENT:       Eyes: PERRL, EOMI, no scleral icterus.       ENT: No discharge from the ears and nose, no pharynx injection, no tonsillar enlargement.        Neck: positive JVD, no bruit, no mass felt. Heme: No neck lymph node enlargement. Cardiac: S1/S2, RRR, No murmurs, No gallops or rubs. Respiratory:  Has rales bilaterally GI: Soft, nondistended, nontender, no rebound pain, no organomegaly, BS present. GU: No hematuria Ext: no pitting leg edema bilaterally. 2+DP/PT pulse bilaterally. Musculoskeletal: No joint deformities, No joint redness or warmth, no limitation of ROM in spin. Skin: No rashes.  Neuro: Alert, oriented X3, cranial nerves II-XII grossly intact, moves all extremities normally. Psych: Patient is not psychotic, no suicidal or hemocidal ideation.  Labs on Admission: I have personally reviewed following labs and imaging studies  CBC: Recent Labs  Lab 01/10/21 1136  WBC 6.3  HGB 12.9*  HCT 40.2  MCV 101.8*  PLT [id number]   Basic Metabolic Panel: Recent Labs  Lab 01/10/21 1136  NA 144  K 4.2  CL 105  CO2 27  GLUCOSE 100*  BUN 32*  CREATININE 0.98  CALCIUM 10.4*   GFR: Estimated Creatinine Clearance: 39.3 mL/min (by C-G formula based on SCr of 0.98 mg/dL). Liver Function Tests: No results for input(s): AST, ALT, ALKPHOS, BILITOT, PROT, ALBUMIN in the last 168 hours. No results for input(s): LIPASE, AMYLASE in the last 168 hours. No results for  input(s): AMMONIA in the last 168 hours. Coagulation Profile: No results for input(s): INR, PROTIME in the last 168 hours. Cardiac Enzymes: No results for input(s): CKTOTAL, CKMB, CKMBINDEX, TROPONINI in the last 168 hours. BNP (last 3 results) No results for input(s): PROBNP in the last 8760 hours. HbA1C: No results for input(s): HGBA1C in the last 72 hours. CBG: No results for input(s): GLUCAP in the last 168 hours. Lipid Profile: No results for input(s): CHOL, HDL, LDLCALC, TRIG, CHOLHDL, LDLDIRECT in the last 72 hours. Thyroid Function Tests: No results for input(s): TSH, T4TOTAL, FREET4, T3FREE, THYROIDAB in the last 72 hours. Anemia Panel: No results for input(s): VITAMINB12, FOLATE, FERRITIN, TIBC, IRON, RETICCTPCT in the last 72 hours. Urine analysis: No results found for: COLORURINE, APPEARANCEUR, LABSPEC, PHURINE, GLUCOSEU, HGBUR, BILIRUBINUR, KETONESUR, PROTEINUR, UROBILINOGEN, NITRITE, LEUKOCYTESUR Sepsis Labs: @LABRCNTIP (procalcitonin:4,lacticidven:4) ) Recent Results (from the past 240 hour(s))  Resp Panel by RT-PCR (Flu A&B, Covid) Nasopharyngeal Swab  Status: None   Collection Time: 01/10/21 11:36 AM   Specimen: Nasopharyngeal Swab; Nasopharyngeal(NP) swabs in vial transport medium  Result Value Ref Range Status   SARS Coronavirus 2 by RT PCR NEGATIVE NEGATIVE Final    Comment: (NOTE) SARS-CoV-2 target nucleic acids are NOT DETECTED.  The SARS-CoV-2 RNA is generally detectable in upper respiratory specimens during the acute phase of infection. The lowest concentration of SARS-CoV-2 viral copies this assay can detect is 138 copies/mL. A negative result does not preclude SARS-Cov-2 infection and should not be used as the sole basis for treatment or other patient management decisions. A negative result may occur with  improper specimen collection/handling, submission of specimen other than nasopharyngeal swab, presence of viral mutation(s) within the areas  targeted by this assay, and inadequate number of viral copies(<138 copies/mL). A negative result must be combined with clinical observations, patient history, and epidemiological information. The expected result is Negative.  Fact Sheet for Patients:  EntrepreneurPulse.com.au  Fact Sheet for Healthcare Providers:  IncredibleEmployment.be  This test is no t yet approved or cleared by the [place] FDA and  has been authorized for detection and/or diagnosis of SARS-CoV-2 by FDA under an Emergency Use Authorization (EUA). This EUA will remain  in effect (meaning this test can be used) for the duration of the COVID-19 declaration under Section 564(b)(1) of the Act, 21 U.S.C.section 360bbb-3(b)(1), unless the authorization is terminated  or revoked sooner.       Influenza A by PCR NEGATIVE NEGATIVE Final   Influenza B by PCR NEGATIVE NEGATIVE Final    Comment: (NOTE) The Xpert Xpress SARS-CoV-2/FLU/RSV plus assay is intended as an aid in the diagnosis of influenza from Nasopharyngeal swab specimens and should not be used as a sole basis for treatment. Nasal washings and aspirates are unacceptable for Xpert Xpress SARS-CoV-2/FLU/RSV testing.  Fact Sheet for Patients: EntrepreneurPulse.com.au  Fact Sheet for Healthcare Providers: IncredibleEmployment.be  This test is not yet approved or cleared by the [place] FDA and has been authorized for detection and/or diagnosis of SARS-CoV-2 by FDA under an Emergency Use Authorization (EUA). This EUA will remain in effect (meaning this test can be used) for the duration of the COVID-19 declaration under Section 564(b)(1) of the Act, 21 U.S.C. section 360bbb-3(b)(1), unless the authorization is terminated or revoked.  Performed at [facility], [address], [place], [place] [address]      Radiological Exams on Admission: DG Chest 2  View  Result Date: 01/10/2021 CLINICAL DATA:  Shortness of breath for 3 days.  Nonsmoker. EXAM: CHEST - 2 VIEW COMPARISON:  Radiographs 12/24/2018. FINDINGS: The heart size is stable at the upper limits of normal. There is aortic atherosclerosis and tortuosity. Compared with the prior study, there are lower lung volumes with increased diffuse subpleural reticulation, central airway thickening and architectural distortion. There is mild blunting of both costophrenic angles without significant pleural effusion or pneumothorax. There is a mild compression deformity near the thoracolumbar junction which does not appear acute. IMPRESSION: Lower lung volumes with increased diffuse subpleural reticulation, central airway thickening and architectural distortion. Findings may reflect progressive interstitial lung disease or superimposed atypical infection, including viral pneumonia. No focal airspace disease. Electronically Signed   By: Richardean Sale M.D.   On: 01/10/2021 12:53   CT Angio Chest PE W and/or Wo Contrast  Result Date: 01/10/2021 CLINICAL DATA:  Shortness of breath for 3 days. High clinical probability for pulmonary embolism. EXAM: CT ANGIOGRAPHY CHEST WITH CONTRAST TECHNIQUE: Multidetector CT  imaging of the chest was performed using the standard protocol during bolus administration of intravenous contrast. Multiplanar CT image reconstructions and MIPs were obtained to evaluate the vascular anatomy. CONTRAST:  78mL OMNIPAQUE IOHEXOL 350 MG/ML SOLN COMPARISON:  None. FINDINGS: Cardiovascular: Satisfactory opacification of pulmonary arteries noted, and no pulmonary emboli identified. 4.1 cm aneurysm of the ascending thoracic aorta noted. Aortic and coronary atherosclerotic calcification noted. Mild cardiomegaly with left ventricular enlargement. Mediastinum/Nodes: No masses or pathologically enlarged lymph nodes identified. Lungs/Pleura: Diffuse interstitial infiltrates are seen, suspicious for  interstitial edema. Small to moderate bilateral pleural effusions are also seen. No evidence of pulmonary consolidation or mass. Calcified pleural plaque is seen bilaterally, consistent with asbestos related pleural disease. Upper abdomen: No acute findings. Musculoskeletal: No suspicious bone lesions identified. Review of the MIP images confirms the above findings. IMPRESSION: No evidence of pulmonary embolism. Cardiomegaly, diffuse interstitial infiltrates, bilateral pleural effusions, suspicious for congestive heart failure. Bilateral calcified pleural plaque, consistent with asbestos related pleural disease. 4.1 cm ascending thoracic aortic aneurysm. Recommend annual imaging followup by CTA or MRA. This recommendation follows 2010 ACCF/AHA/AATS/ACR/ASA/SCA/SCAI/SIR/STS/SVM Guidelines for the Diagnosis and Management of Patients with Thoracic Aortic Disease. Circulation. 2010; [id number]. Aortic aneurysm NOS (ICD10-I71.9) Aortic Atherosclerosis (ICD10-I70.0). Electronically Signed   By: Marlaine Hind M.D.   On: 01/10/2021 14:33     EKG: I have personally reviewed.  Sinus rhythm, QTC 465, LVH, LAE, poor R wave progression, T wave inversion in V5-V6, Q waves in the inferior leads   Assessment/Plan Principal Problem:   Acute CHF (congestive heart failure) (HCC) Active Problems:   Hypothyroidism   HLD (hyperlipidemia)   Acute respiratory failure with hypoxia (HCC)   Ascending aortic aneurysm (HCC)   Elevated troponin  Acute respiratory failure with hypoxia due to acute CHF (congestive heart failure): Patient does not have leg edema, but has positive JVD, shortness of breath, orthopnea, elevated BNP 1927, pulm edema on chest x-ray and CT angiogram.  CT angiogram is negative for PE.  Clinically consistent with new onset acute CHF.  Patient does not have 2D echo on record, not sure which of CHF.  Will get 2D echo for further evaluation.  Dr. Fletcher Anon of cardiology is consulted.  -Will admit to  progressive unit as inpatient -Lasix 20 mg bid by IV - start ASA 81 mg daily - start lisinopril 2.5 mg daily -trend trop -2d echo -Daily weights -strict I/O's -Low salt diet -Fluid restriction -Obtain REDs Vest reading  Addendum: trop comes back 213, denies Chest pain -will add lipitor 40 mg daily -will check A1c and FLP -f/u 2d echo.   Hypothyroidism -Continue Synthroid  HLD (hyperlipidemia): Patient is not taking medications currently -Check FLP  Ascending aortic aneurysm ([place]): Incidental finding by CT angiogram, 1 cm ascending aortic aneurysm -Follow-up with PCP    DVT ppx: SQ Lovenox Code Status: DNR per his niece who is power of attorney Family Communication:   Yes, patient's niece at bedside disposition Plan:  Anticipate discharge back to previous environment Consults called:  Dr. Fletcher Anon of card Admission status: Med-surg bed as inpt          Date of Service 01/10/2021    [facility]   If 7PM-7AM, please contact night-coverage [email or web address] 01/10/2021, 5:38 PM

## 2021-01-10 NOTE — ED Notes (Signed)
Pt transported to xray 

## 2021-01-10 NOTE — ED Notes (Signed)
Date and time results received: 01/10/21 2146 (use smartphrase ".now" to insert current time)  Test: Troponin  Critical Value: 235  Name of Provider Notified: Randol Kern, NP

## 2021-01-10 NOTE — ED Notes (Signed)
Pt ambulated with pulse ox at this time. Pt oxygen saturation dropped to 85% and maintained in mid-high 80s. Pt denies any complaints or increasing SOB. MD made aware

## 2021-01-10 NOTE — ED Triage Notes (Signed)
Pt to ED via [facility] from home. Per EMS pt RA sats with FD arrived was 78%. Pt placed on NRB with improvement to 98%. RA sats 94% upon arrival. VSS. Pt c/o SOB x3 days. CBG 157.

## 2021-01-11 ENCOUNTER — Encounter: Payer: Self-pay | Admitting: Internal Medicine

## 2021-01-11 ENCOUNTER — Inpatient Hospital Stay (HOSPITAL_COMMUNITY)
Admit: 2021-01-11 | Discharge: 2021-01-11 | Disposition: A | Discharge status: Home / Self Care | Payer: Medicare Other | Attending: Internal Medicine | Admitting: Internal Medicine

## 2021-01-11 DIAGNOSIS — R778 Other specified abnormalities of plasma proteins: Secondary | ICD-10-CM

## 2021-01-11 DIAGNOSIS — E039 Hypothyroidism, unspecified: Secondary | ICD-10-CM

## 2021-01-11 DIAGNOSIS — E785 Hyperlipidemia, unspecified: Secondary | ICD-10-CM

## 2021-01-11 DIAGNOSIS — I712 Thoracic aortic aneurysm, without rupture: Secondary | ICD-10-CM

## 2021-01-11 DIAGNOSIS — I5021 Acute systolic (congestive) heart failure: Secondary | ICD-10-CM

## 2021-01-11 DIAGNOSIS — I509 Heart failure, unspecified: Secondary | ICD-10-CM | POA: Diagnosis not present

## 2021-01-11 LAB — BASIC METABOLIC PANEL
Anion gap: 9 (ref 5–15)
BUN: 25 mg/dL — ABNORMAL HIGH (ref 8–23)
CO2: 27 mmol/L (ref 22–32)
Calcium: 10 mg/dL (ref 8.9–10.3)
Chloride: 105 mmol/L (ref 98–111)
Creatinine, Ser: 0.94 mg/dL (ref 0.61–1.24)
GFR, Estimated: >60 mL/min (ref >60)
Glucose, Bld: 103 mg/dL — ABNORMAL HIGH (ref 70–99)
Potassium: 4.3 mmol/L (ref 3.5–5.1)
Sodium: 141 mmol/L (ref 135–145)

## 2021-01-11 LAB — CBC
HCT: 42.1 % (ref 39.0–52.0)
Hemoglobin: 13.7 g/dL (ref 13.0–17.0)
MCH: 32.7 pg (ref 26.0–34.0)
MCHC: 32.5 g/dL (ref 30.0–36.0)
MCV: 100.5 fL — ABNORMAL HIGH (ref 80.0–100.0)
Platelets: 211 10*3/uL (ref 150–400)
RBC: 4.19 MIL/uL — ABNORMAL LOW (ref 4.22–5.81)
RDW: 14.6 % (ref 11.5–15.5)
WBC: 6.1 10*3/uL (ref 4.0–10.5)
nRBC: 0 % (ref 0.0–0.2)

## 2021-01-11 LAB — ECHOCARDIOGRAM COMPLETE
AR max vel: 2.54 cm2
AV Area VTI: 2.46 cm2
AV Area mean vel: 2.84 cm2
AV Mean grad: 2 mmHg
AV Peak grad: 6 mmHg
Ao pk vel: 1.22 m/s
Area-P 1/2: 4.93 cm2
Height: 68 in
MV VTI: 2.39 cm2
P 1/2 time: 421 ms
S' Lateral: 4.45 cm
Weight: 2128 [oz_av]

## 2021-01-11 LAB — LIPID PANEL
Cholesterol: 126 mg/dL (ref 0–200)
HDL: 32 mg/dL — ABNORMAL LOW (ref >40)
LDL Cholesterol: 78 mg/dL (ref 0–99)
Total CHOL/HDL Ratio: 3.9 RATIO
Triglycerides: 81 mg/dL (ref <150)
VLDL: 16 mg/dL (ref 0–40)

## 2021-01-11 LAB — MAGNESIUM: Magnesium: 2.2 mg/dL (ref 1.7–2.4)

## 2021-01-11 LAB — TROPONIN I (HIGH SENSITIVITY): Troponin I (High Sensitivity): 240 ng/L (ref <18)

## 2021-01-11 LAB — HEMOGLOBIN A1C
Hgb A1c MFr Bld: 5.2 % (ref 4.8–5.6)
Mean Plasma Glucose: 102.54 mg/dL

## 2021-01-11 MED ORDER — CARVEDILOL 3.125 MG PO TABS
3.1250 mg | ORAL_TABLET | Freq: Two times a day (BID) | ORAL | Status: DC
  Administered 2021-01-11 – 2021-01-18 (×13): 3.125 mg via ORAL
  Filled 2021-01-11 (×13): qty 1

## 2021-01-11 NOTE — Consult Note (Signed)
Cardiology Consultation:   Patient ID: Adam Bentley MRN: [id number]; DOB: Dec 21, 1924  Admit date: 01/10/2021 Date of Consult: 01/11/2021  Primary Care Provider: Venia Carbon, MD [facility] Cardiologist: [place] Electrophysiologist:  None    Patient Profile:   Adam Bentley. Adam Bentley is a 87 y.o. male with a hx of HLD, hypothyroisdism, colon cancer, hearing loss, BPH, and diplopia who is being seen today for the evaluation of new onset heart failure at the request of Dr. Dwyane Dee.  History of Present Illness:   Mr. Adam Bentley has not been seen by cardiology in the past. No prior ischemic work-up. He takes synthroid for hypothyroidism. No known allergies. Remote history of smoking (50 years ago), he reported alcohol use, 6 beers a week. No drug history. Family history with MI in mother at 81 (deceased). Patient reports he is relatively active for his age.   The patient presented from SNF to the ED on 01/11/20 for shortness of breath. Symptoms have been progressive for the past 6 days. It was a gradual onset. Shortness of breath is worse on exertion. Reported orthopnea and weakness, no PND. Denied chest pain and palpitations. Possible fevers, but no chills. No recent falls. No lower leg edema.   In the ED he was hypoxic and placed on NRB with improvement. COVID negative. BP 138/88, RR 27. Labs showed BNP 1927, WBC 6.3, troponin 213>235, creatinine 0.98, BUN 32, Hgb 12.9. EKG showed SR, 79bpm, with LVH and possible repolarization abnormalities. CXR showed interstitial edema. CT chest negative for PE, but showed cardiomegaly and b/i interstitial edema. The patient was admitted for further work-up    Past Medical History:  Diagnosis Date  . BPH (benign prostatic hypertrophy)   . Dental bridge present    permanent upper  . Diplopia 1986   concussion  . Hearing aid worn    bilateral  . History of colon cancer   . Hyperlipidemia   . Hypothyroidism   . Thyroid disease   . Wears  dentures    full lower    Past Surgical History:  Procedure Laterality Date  . CATARACT EXTRACTION W/PHACO Left 08/21/2017   Procedure: CATARACT EXTRACTION PHACO AND INTRAOCULAR LENS PLACEMENT ([place]) LEFT;  Surgeon: Eulogio Bear, MD;  Location: [place];  Service: Ophthalmology;  Laterality: Left;  . CATARACT EXTRACTION W/PHACO Right 09/10/2017   Procedure: CATARACT EXTRACTION PHACO AND INTRAOCULAR LENS PLACEMENT ([place]) RIGHT;  Surgeon: Eulogio Bear, MD;  Location: [place];  Service: Ophthalmology;  Laterality: Right;  . HERNIA REPAIR    . PARTIAL COLECTOMY  03/1999   ([place]) chemo with [place]  . TURP VAPORIZATION  1993     Home Medications:  Prior to Admission medications   Medication Sig Start Date End Date Taking? Authorizing Provider  Apoaequorin (PREVAGEN PO) Take 1 tablet by mouth daily.   Yes [provider]  cyanocobalamin 1000 MCG tablet Take 1,000 mcg by mouth daily.   Yes [provider]  levothyroxine (SYNTHROID) 100 MCG tablet TAKE 1 TABLET BY MOUTH EVERY DAY 08/25/20  Yes Venia Carbon, MD  Multiple Vitamins-Minerals (OCUVITE PO) Take 1 tablet by mouth daily.   Yes [provider]    Inpatient Medications: Scheduled Meds: . Apoaequorin  1 capsule Oral Daily  . aspirin EC  81 mg Oral Daily  . atorvastatin  40 mg Oral Daily  . enoxaparin (LOVENOX) injection  40 mg Subcutaneous Q24H  . furosemide  20 mg Intravenous Q12H  . levothyroxine  100 mcg Oral Daily  . lisinopril  2.5 mg Oral Daily  . sodium chloride flush  3 mL Intravenous Q12H  . cyanocobalamin  1,000 mcg Oral Daily   Continuous Infusions: . sodium chloride     PRN Meds: sodium chloride, acetaminophen, albuterol, dextromethorphan-guaiFENesin, ondansetron (ZOFRAN) IV, sodium chloride flush  Allergies:   No Known Allergies  Social History:   Social History   Socioeconomic History  . Marital status: Single    Spouse name: Not on file  .  Number of children: Not on file  . Years of education: Not on file  . Highest education level: Not on file  Occupational History  . 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 Smoking status: Never Smoker  . Smokeless tobacco: Never Used  Substance and Sexual Activity  . Alcohol use: Yes    Comment: 2 beers/mo  . Drug use: No  . Sexual activity: Not on file  Other Topics Concern  . Not on file  Social History Narrative   Has living will    Requests niece [name] (or nephew Delfino Lovett) as health care power of attorney   Has DNR already from me   No tube feeds if cognitively unaware (unless clearly seems reversible)   Social Determinants of Radio broadcast assistant Strain: Not on file  Food Insecurity: Not on file  Transportation Needs: Not on file  Physical Activity: Not on file  Stress: Not on file  Social Connections: Not on file  Intimate Partner Violence: Not on file    Family History:   Family History  Problem Relation Age of Onset  . Dementia Sister   . Lung cancer Brother      ROS:  Please see the history of present illness.  All other ROS reviewed and negative.     Physical Exam/Data:   Vitals:   01/11/21 0430 01/11/21 0500 01/11/21 0530 01/11/21 0600  BP: 132/79 131/71 126/68 131/62  Pulse: 65 69 84 68  Resp: 18 (!) 24 20 18   Temp:      TempSrc:      SpO2: 98% 93% 95% 95%  Weight:      Height:        Intake/Output Summary (Last 24 hours) at 01/11/2021 0710 Last data filed at 01/11/2021 0622 Gross per 24 hour  Intake --  Output 100 ml  Net -100 ml   Last 3 Weights 01/10/2021 06/02/2020 05/27/2019  Weight (lbs) 133 lb 144 lb 155 lb  Weight (kg) 60.328 kg 65.318 kg 70.308 kg     Body mass index is 20.22 kg/m.  Bentley:  Well nourished, well developed, in no acute distress HEENT: normal Lymph: no adenopathy Neck: no JVD Endocrine:  No thryomegaly Vascular: No carotid bruits; FA pulses 2+ bilaterally  without bruits  Cardiac:  normal S1, S2; RRR; no murmur  Lungs:  clear to auscultation bilaterally, no wheezing, rhonchi or rales  Abd: soft, nontender, no hepatomegaly  Ext: no edema Musculoskeletal:  No deformities, BUE and BLE strength normal and equal Skin: warm and dry  Neuro:  CNs 2-12 intact, no focal abnormalities noted Psych:  Normal affect   EKG:  The EKG was personally reviewed and demonstrates:  NSR, 79 bpm, LVH with repol abnormalities, 1 waves inferior leads Telemetry:  Telemetry was personally reviewed and demonstrates:  NSR, PACs, HR 80-90s  Relevant CV Studies:  Echo ordered  Laboratory Data:  High Sensitivity Troponin:   Recent  Labs  Lab 01/10/21 1136 01/10/21 2017 01/11/21 0309  TROPONINIHS 213* 235* 240*     Chemistry Recent Labs  Lab 01/10/21 1136 01/11/21 0309  NA 144 141  K 4.2 4.3  CL 105 105  CO2 27 27  GLUCOSE 100* 103*  BUN 32* 25*  CREATININE 0.98 0.94  CALCIUM 10.4* 10.0  GFRNONAA >60 >60  ANIONGAP 12 9    No results for input(s): PROT, ALBUMIN, AST, ALT, ALKPHOS, BILITOT in the last 168 hours. Hematology Recent Labs  Lab 01/10/21 1136 01/11/21 0309  WBC 6.3 6.1  RBC 3.95* 4.19*  HGB 12.9* 13.7  HCT 40.2 42.1  MCV 101.8* 100.5*  MCH 32.7 32.7  MCHC 32.1 32.5  RDW 14.5 14.6  PLT 229 211   BNP Recent Labs  Lab 01/10/21 1135  BNP 1,927.9*    DDimer No results for input(s): DDIMER in the last 168 hours.   Radiology/Studies:  DG Chest 2 View  Result Date: 01/10/2021 CLINICAL DATA:  Shortness of breath for 3 days.  Nonsmoker. EXAM: CHEST - 2 VIEW COMPARISON:  Radiographs 12/24/2018. FINDINGS: The heart size is stable at the upper limits of normal. There is aortic atherosclerosis and tortuosity. Compared with the prior study, there are lower lung volumes with increased diffuse subpleural reticulation, central airway thickening and architectural distortion. There is mild blunting of both costophrenic angles without  significant pleural effusion or pneumothorax. There is a mild compression deformity near the thoracolumbar junction which does not appear acute. IMPRESSION: Lower lung volumes with increased diffuse subpleural reticulation, central airway thickening and architectural distortion. Findings may reflect progressive interstitial lung disease or superimposed atypical infection, including viral pneumonia. No focal airspace disease. Electronically Signed   By: Richardean Sale M.D.   On: 01/10/2021 12:53   CT Angio Chest PE W and/or Wo Contrast  Result Date: 01/10/2021 CLINICAL DATA:  Shortness of breath for 3 days. High clinical probability for pulmonary embolism. EXAM: CT ANGIOGRAPHY CHEST WITH CONTRAST TECHNIQUE: Multidetector CT imaging of the chest was performed using the standard protocol during bolus administration of intravenous contrast. Multiplanar CT image reconstructions and MIPs were obtained to evaluate the vascular anatomy. CONTRAST:  20mL OMNIPAQUE IOHEXOL 350 MG/ML SOLN COMPARISON:  None. FINDINGS: Cardiovascular: Satisfactory opacification of pulmonary arteries noted, and no pulmonary emboli identified. 4.1 cm aneurysm of the ascending thoracic aorta noted. Aortic and coronary atherosclerotic calcification noted. Mild cardiomegaly with left ventricular enlargement. Mediastinum/Nodes: No masses or pathologically enlarged lymph nodes identified. Lungs/Pleura: Diffuse interstitial infiltrates are seen, suspicious for interstitial edema. Small to moderate bilateral pleural effusions are also seen. No evidence of pulmonary consolidation or mass. Calcified pleural plaque is seen bilaterally, consistent with asbestos related pleural disease. Upper abdomen: No acute findings. Musculoskeletal: No suspicious bone lesions identified. Review of the MIP images confirms the above findings. IMPRESSION: No evidence of pulmonary embolism. Cardiomegaly, diffuse interstitial infiltrates, bilateral pleural effusions,  suspicious for congestive heart failure. Bilateral calcified pleural plaque, consistent with asbestos related pleural disease. 4.1 cm ascending thoracic aortic aneurysm. Recommend annual imaging followup by CTA or MRA. This recommendation follows 2010 ACCF/AHA/AATS/ACR/ASA/SCA/SCAI/SIR/STS/SVM Guidelines for the Diagnosis and Management of Patients with Thoracic Aortic Disease. Circulation. 2010; 121: Z610-R604. Aortic aneurysm NOS (ICD10-I71.9) Aortic Atherosclerosis (ICD10-I70.0). Electronically Signed   By: Marlaine Hind M.D.   On: 01/10/2021 14:33     Assessment and Plan:   Acute respiratory failure Acute heart failure - presented from SNF with a couple days of progressive sob and orthopnea. BNP elevated and CXR  with interstitial edema. Imaging negative for PE. Kidney function wnl. - started on IV lasix 20mg  BID - started on lisinopril 2.5mg   - consider low dose BB as well - echo ordered - Patient put out -160mL so far. He reports breathing is improving. He does not appear significantly volume overloaded on exam.  - continue strict I/Os, daily weights, monitor creatinine with diuresis - further recs pending echo. If EF is low he might need an ischemic evaluation.  Elevated troponin - HS troponin 213>235>240 - no known CAD however has RF such as positive family history and HLD.  - chest CT with aortic and coronary atherosclerotic calcification. - No active chest pain - started on Aspirin 81mg  daily, atorvastatin 40mg  daily, and lisinopril 2.5mg  daily - A1C pending - Ischemic evaluation pending echo  Hypothyroidism - continue synthroid  HLD - PTA not on medications - Lipid panel showed LDL 81, HDL 32, chol 126, TG 81 - started on atorvastatin 40mg  daily  Ascending aortic aneurysm - per CT this admission measuring at 4.1cm. Recommend annual imaging.   New York Heart Association (NYHA) Functional Class NYHA Class II    For questions or updates, please contact [facility] Please consult [email or web address] for contact info under    Signed, Shayna Eblen Ninfa Meeker, PA-C  01/11/2021 7:10 AM

## 2021-01-11 NOTE — ED Notes (Signed)
Echo at bedside

## 2021-01-11 NOTE — Progress Notes (Signed)
*  PRELIMINARY RESULTS* Echocardiogram 2D Echocardiogram has been performed.  Wallie Char Sandy Haye 01/11/2021, 9:28 AM

## 2021-01-11 NOTE — Evaluation (Signed)
Occupational Therapy Evaluation Patient Details Name: Adam Bentley. Stauber MRN: [id number] DOB: [birth year] Today's Date: 01/11/2021    History of Present Illness 87 y.o. male with medical history significant of hyperlipidemia, hypothyroidism, colon cancer, hearing loss, BPH, diplopia, who presents with shortness of breath for more than 3 days, which has been progressively worsening. Negative for PE, covid.   Clinical Impression   Pt seen for OT evaluation this date in setting of acute hospitalization d/t SOB. Pt presents reporting that he is feeling better at this time. Pt states he is INDEP with fxl mobility with occasional use of cane and describes furniture walking in the home. Pt states he performs all self care and has some assist from his niece for IADLs such as transportation and groceries. Pt appears to be primarily at his baseline, performing ADL transfers with MOD I and seated versus standing UB and LB ADLs with MOD I. No acute OT needs detected at this time and do not anticipate need for continued skilled OT upon d/c from acute setting.     Follow Up Recommendations  No OT follow up    Equipment Recommendations  None recommended by OT    Recommendations for Other Services       Precautions / Restrictions Precautions Precautions: Fall Restrictions Weight Bearing Restrictions: No      Mobility Bed Mobility Overal bed mobility: Modified Independent                  Transfers Overall transfer level: Modified independent                    Balance Overall balance assessment: Modified Independent                                         ADL either performed or assessed with clinical judgement   ADL Overall ADL's : Modified independent;At baseline                                             Vision Baseline Vision/History: Wears glasses Wears Glasses: At all times Patient Visual Report: No change from baseline        Perception     Praxis      Pertinent Vitals/Pain Pain Assessment: No/denies pain     Hand Dominance     Extremity/Trunk Assessment Upper Extremity Assessment Upper Extremity Assessment: Overall WFL for tasks assessed;Generalized weakness (WFL for age)   Lower Extremity Assessment Lower Extremity Assessment: Overall WFL for tasks assessed;Generalized weakness (WFL for age)       Communication Communication Communication: No difficulties   Cognition Arousal/Alertness: Awake/alert Behavior During Therapy: WFL for tasks assessed/performed Overall Cognitive Status: Within Functional Limits for tasks assessed                                     General Comments       Exercises Other Exercises Other Exercises: OT educates re: role of OT. Pt with good understanding.   Shoulder Instructions      Home Living Family/patient expects to be discharged to:: Assisted living   Available Help at Discharge: Family (niece stops in QW, takes him grocery shopping, etc) Type of Home:  Assisted living                       Home Equipment: [place] - 2 wheels          Prior Functioning/Environment Level of Independence: Independent with assistive device(s)        Comments: Pt reports he often does not use AD in his apartment        OT Problem List: Decreased activity tolerance      OT Treatment/Interventions: Self-care/ADL training;Therapeutic activities    OT Goals(Current goals can be found in the care plan section) Acute Rehab OT Goals Patient Stated Goal: go home OT Goal Formulation: All assessment and education complete, DC therapy  OT Frequency: Min 1X/week   Barriers to D/C:            Co-evaluation              AM-PAC OT "6 Clicks" Daily Activity     Outcome Measure Help from another person eating meals?: None Help from another person taking care of personal grooming?: None Help from another person toileting, which includes  using toliet, bedpan, or urinal?: None Help from another person bathing (including washing, rinsing, drying)?: None Help from another person to put on and taking off regular upper body clothing?: None Help from another person to put on and taking off regular lower body clothing?: None 6 Click Score: 24   End of Session Equipment Utilized During Treatment: Gait belt;Rolling walker Nurse Communication: Mobility status;Other (comment) (notified RN that pt asking about when he can d/c)  Activity Tolerance: Patient tolerated treatment well Patient left: in bed;with call bell/phone within reach  OT Visit Diagnosis: Muscle weakness (generalized) (M62.81)                Time: 8828-0034 OT Time Calculation (min): 16 min Charges:  OT General Charges $OT Visit: 1 Visit OT Evaluation $OT Eval Low Complexity: 1 Low OT Treatments $Self Care/Home Management : 8-22 mins  Gerrianne Scale, MS, OTR/L ascom [phone] 01/11/21, 5:10 PM

## 2021-01-11 NOTE — Evaluation (Signed)
Physical Therapy Evaluation Patient Details Name: Adam Bentley. Valadez MRN: [id number] DOB: 1924/03/02 Today's Date: 01/11/2021   History of Present Illness  87 y.o. male with medical history significant of hyperlipidemia, hypothyroidism, colon cancer, hearing loss, BPH, diplopia, who presents with shortness of breath for more than 3 days, which has been progressively worsening. Negative for PE, covid.  Clinical Impression  Pt did well with PT exam and he was able to ambulation ~75 ft in the hall w/o excessive reliance on the walker.  Pt on room air t/o the session, he had some mild fatigue but stayed in the 90s t/o the effort.  He reports feeling a little weaker than baseline, but agrees that he will be able to go home once medically cleared.  He will benefit from PT at discharge.   Follow Up Recommendations Home health PT (apparently he has worked with the PT at his facility in the past)    Equipment Recommendations  None recommended by PT    Recommendations for Other Services       Precautions / Restrictions Precautions Precautions: Fall Restrictions Weight Bearing Restrictions: No      Mobility  Bed Mobility Overal bed mobility: Modified Independent             General bed mobility comments: able to get to sitting EOB and don shoes w/o assist    Transfers Overall transfer level: Modified independent Equipment used: Rolling walker (2 wheeled)             General transfer comment: Pt able to rise to standing with good confidence, no excessive use of walker  Ambulation/Gait Ambulation/Gait assistance: Supervision Gait Distance (Feet): 100 Feet Assistive device: Rolling walker (2 wheeled)       General Gait Details: Pt was able to ambulate in the hallway with no LOBs and good confidence.  He had some mild fatigue, but on room air O2 remained in the 90s. Pty reports feeling close to his baseline and confident with his mobiltiy.  Stairs            Wheelchair  Mobility    Modified Rankin (Stroke Patients Only)       Balance Overall balance assessment: Modified Independent                                           Pertinent Vitals/Pain Pain Assessment: No/denies pain    Home Living Family/patient expects to be discharged to:: Assisted living   Available Help at Discharge: Family (niece stops in QW, takes him grocery shopping, etc) Type of Home: Assisted living         Home Equipment: [place] - 2 wheels      Prior Function Level of Independence: Independent with assistive device(s)         Comments: Pt reports he often does not use AD in his apartment     Hand Dominance        Extremity/Trunk Assessment   Upper Extremity Assessment Upper Extremity Assessment: Generalized weakness;Overall WFL for tasks assessed (age appropriate)    Lower Extremity Assessment Lower Extremity Assessment:  (age appropriate)       Communication   Communication: No difficulties  Cognition Arousal/Alertness: Awake/alert Behavior During Therapy: WFL for tasks assessed/performed Overall Cognitive Status: Within Functional Limits for tasks assessed  General Comments      Exercises     Assessment/Plan    PT Assessment Patient needs continued PT services  PT Problem List Decreased strength;Decreased range of motion;Decreased activity tolerance;Decreased balance;Decreased safety awareness;Cardiopulmonary status limiting activity;Decreased knowledge of use of DME       PT Treatment Interventions DME instruction;Gait training;Stair training;Therapeutic activities;Functional mobility training;Therapeutic exercise;Balance training;Patient/family education    PT Goals (Current goals can be found in the Care Plan section)  Acute Rehab PT Goals Patient Stated Goal: go home PT Goal Formulation: With patient Time For Goal Achievement: 01/25/21 Potential to Achieve  Goals: Fair    Frequency Min 2X/week   Barriers to discharge        Co-evaluation               AM-PAC PT "6 Clicks" Mobility  Outcome Measure Help needed turning from your back to your side while in a flat bed without using bedrails?: None Help needed moving from lying on your back to sitting on the side of a flat bed without using bedrails?: None Help needed moving to and from a bed to a chair (including a wheelchair)?: None Help needed standing up from a chair using your arms (e.g., wheelchair or bedside chair)?: None Help needed to walk in hospital room?: A Little Help needed climbing 3-5 steps with a railing? : A Little 6 Click Score: 22    End of Session Equipment Utilized During Treatment: Gait belt Activity Tolerance: Patient tolerated treatment well Patient left: with call bell/phone within reach;in bed Nurse Communication: Mobility status PT Visit Diagnosis: Muscle weakness (generalized) (M62.81);Difficulty in walking, not elsewhere classified (R26.2)    Time: 2831-5176 PT Time Calculation (min) (ACUTE ONLY): 28 min   Charges:   PT Evaluation $PT Eval Low Complexity: 1 Low PT Treatments $Gait Training: 8-22 mins        Kreg Shropshire, DPT 01/11/2021, 11:24 AM

## 2021-01-11 NOTE — ED Notes (Signed)
Dr. End at bedside. 

## 2021-01-11 NOTE — Progress Notes (Signed)
PROGRESS NOTE    Adam Bentley  [id number] DOB: December 26, 1923 DOA: 01/10/2021 PCP: Venia Carbon, MD    Brief Narrative:  This 87 years old male with PMH significant for hyperlipidemia, hypothyroidism, colon cancer, hearing loss, BPH, diplopia who presents with acute shortness of breath.  Patient reports having shortness of breath for more than 3 days associated with orthopnea and generalized weakness.  Patient was found to be  desatting at 78% on room air which improved to 98% on rebreather on arrival.  He is found to have BNP 1927.  Chest x-ray shows interstitial edema,  CTA chest ruled out PE but showed cardiomegaly and bilateral interstitial edema.  Patient is admitted for new onset CHF, Cardiology consulted,  recommended aggressive diuresis, Patient may need ischemic evaluation given decrease EF on recent echocardiogram.  Assessment & Plan:   Principal Problem:   Acute CHF (congestive heart failure) ([facility]) Active Problems:   Hypothyroidism   HLD (hyperlipidemia)   Acute respiratory failure with hypoxia (HCC)   Ascending aortic aneurysm (HCC)   Elevated troponin  Acute hypoxic respiratory failure due to acute CHF (congestive heart failure):  Patient presented with acute shortness of breath,  found to have O2 saturation of 78% on room air. Patient was placed on nonrebreather O2 saturation improved to 98%. Patient does not have leg edema, but has positive JVD, shortness of breath, orthopnea,  Labs: elevated BNP 1927, pulm edema on chest x-ray. and CT angiogram.   CT angiogram is negative for PE.   Clinically consistent with new onset acute CHF.   2D echocardiogram shows  LVEF 25-30%. Left ventricle has severely decreased function.  Global hypokinesis. Cardiology consulted recommended continue diuresis. Continue lisinopril 2.5 mg, aspirin 81 mg,  continue Lasix 20 mg IV twice daily. Daily weight, intake output charting. Elevated troponin could be due to demand ischemia not  ACS. Patient may need left heart cath once adequate diuresis is accomplished.  Elevated troponin: Could be secondary to demand ischemia. Cardiology does not think patient is having ACS Due to reduced EF on recent echocardiogram,  cardiology is planning to do cath once adequate diuresis accomplished.   Hypothyroidism -Continue Synthroid  HLD (hyperlipidemia): Continue Lipitor  Ascending aortic aneurysm ([place]): Incidental finding by CT angiogram, 1 cm ascending aortic aneurysm -Follow-up with PCP    DVT prophylaxis: Lovenox Code Status: DNR Family Communication: No family at bedside Disposition Plan:  Status is: Inpatient  Remains inpatient appropriate because:Inpatient level of care appropriate due to severity of illness   Dispo: The patient is from: Home              Anticipated d/c is to: SNF              Anticipated d/c date is: > 3 days              Patient currently is not medically stable to d/c.  Consultants:   Cardiology  Procedures: 2D echocardiogram Antimicrobials:  Anti-infectives (From admission, onward)   None      Subjective: Patient was seen and examined at bedside.  Overnight events noted.  Patient was sitting comfortably denies any chest pain or shortness of breath.  Objective: Vitals:   01/11/21 1300 01/11/21 1330 01/11/21 1400 01/11/21 1430  BP: 122/60 114/64 (!) 112/54 118/62  Pulse: 79 66 79 74  Resp: (!) 22 19 19  (!) 29  Temp:      TempSrc:      SpO2: 100% 100% 99% 97%  Weight:  Height:        Intake/Output Summary (Last 24 hours) at 01/11/2021 1630 Last data filed at 01/11/2021 [id number] Gross per 24 hour  Intake --  Output 100 ml  Net -100 ml   Filed Weights   01/10/21 1132  Weight: 60.3 kg    Examination:  General exam: Appears calm and comfortable, not in any acute distress Respiratory system: Clear to auscultation. Respiratory effort normal. Cardiovascular system: S1 & S2 heard, RRR. No JVD, murmurs, rubs, gallops  or clicks. No pedal edema. Gastrointestinal system: Abdomen is nondistended, soft and nontender. No organomegaly or masses felt. Normal bowel sounds heard. Central nervous system: Alert and oriented. No focal neurological deficits. Extremities: Symmetric 5 x 5 power. Skin: No rashes, lesions or ulcers Psychiatry: Judgement and insight appear normal. Mood & affect appropriate.     Data Reviewed: I have personally reviewed following labs and imaging studies  CBC: Recent Labs  Lab 01/10/21 1136 01/11/21 0309  WBC 6.3 6.1  HGB 12.9* 13.7  HCT 40.2 42.1  MCV 101.8* 100.5*  PLT 229 [id number]   Basic Metabolic Panel: Recent Labs  Lab 01/10/21 1136 01/11/21 0309  NA 144 141  K 4.2 4.3  CL 105 105  CO2 27 27  GLUCOSE 100* 103*  BUN 32* 25*  CREATININE 0.98 0.94  CALCIUM 10.4* 10.0  MG  --  2.2   GFR: Estimated Creatinine Clearance: 41 mL/min (by C-G formula based on SCr of 0.94 mg/dL). Liver Function Tests: No results for input(s): AST, ALT, ALKPHOS, BILITOT, PROT, ALBUMIN in the last 168 hours. No results for input(s): LIPASE, AMYLASE in the last 168 hours. No results for input(s): AMMONIA in the last 168 hours. Coagulation Profile: No results for input(s): INR, PROTIME in the last 168 hours. Cardiac Enzymes: No results for input(s): CKTOTAL, CKMB, CKMBINDEX, TROPONINI in the last 168 hours. BNP (last 3 results) No results for input(s): PROBNP in the last 8760 hours. HbA1C: Recent Labs    01/11/21 0309  HGBA1C 5.2   CBG: No results for input(s): GLUCAP in the last 168 hours. Lipid Profile: Recent Labs    01/11/21 0309  CHOL 126  HDL 32*  LDLCALC 78  TRIG 81  CHOLHDL 3.9   Thyroid Function Tests: No results for input(s): TSH, T4TOTAL, FREET4, T3FREE, THYROIDAB in the last 72 hours. Anemia Panel: No results for input(s): VITAMINB12, FOLATE, FERRITIN, TIBC, IRON, RETICCTPCT in the last 72 hours. Sepsis Labs: No results for input(s): PROCALCITON, LATICACIDVEN in  the last 168 hours.  Recent Results (from the past 240 hour(s))  Resp Panel by RT-PCR (Flu A&B, Covid) Nasopharyngeal Swab     Status: None   Collection Time: 01/10/21 11:36 AM   Specimen: Nasopharyngeal Swab; Nasopharyngeal(NP) swabs in vial transport medium  Result Value Ref Range Status   SARS Coronavirus 2 by RT PCR NEGATIVE NEGATIVE Final    Comment: (NOTE) SARS-CoV-2 target nucleic acids are NOT DETECTED.  The SARS-CoV-2 RNA is generally detectable in upper respiratory specimens during the acute phase of infection. The lowest concentration of SARS-CoV-2 viral copies this assay can detect is 138 copies/mL. A negative result does not preclude SARS-Cov-2 infection and should not be used as the sole basis for treatment or other patient management decisions. A negative result may occur with  improper specimen collection/handling, submission of specimen other than nasopharyngeal swab, presence of viral mutation(s) within the areas targeted by this assay, and inadequate number of viral copies(<138 copies/mL). A negative result must be combined with  clinical observations, patient history, and epidemiological information. The expected result is Negative.  Fact Sheet for Patients:  BloggerCourse.com  Fact Sheet for Healthcare Providers:  SeriousBroker.it  This test is no t yet approved or cleared by the [place] FDA and  has been authorized for detection and/or diagnosis of SARS-CoV-2 by FDA under an Emergency Use Authorization (EUA). This EUA will remain  in effect (meaning this test can be used) for the duration of the COVID-19 declaration under Section 564(b)(1) of the Act, 21 U.S.C.section 360bbb-3(b)(1), unless the authorization is terminated  or revoked sooner.       Influenza A by PCR NEGATIVE NEGATIVE Final   Influenza B by PCR NEGATIVE NEGATIVE Final    Comment: (NOTE) The Xpert Xpress SARS-CoV-2/FLU/RSV plus assay  is intended as an aid in the diagnosis of influenza from Nasopharyngeal swab specimens and should not be used as a sole basis for treatment. Nasal washings and aspirates are unacceptable for Xpert Xpress SARS-CoV-2/FLU/RSV testing.  Fact Sheet for Patients: BloggerCourse.com  Fact Sheet for Healthcare Providers: SeriousBroker.it  This test is not yet approved or cleared by the [place] FDA and has been authorized for detection and/or diagnosis of SARS-CoV-2 by FDA under an Emergency Use Authorization (EUA). This EUA will remain in effect (meaning this test can be used) for the duration of the COVID-19 declaration under Section 564(b)(1) of the Act, 21 U.S.C. section 360bbb-3(b)(1), unless the authorization is terminated or revoked.  Performed at [facility], [address] [place], [place] [address]     Radiology Studies: DG Chest 2 View  Result Date: 01/10/2021 CLINICAL DATA:  Shortness of breath for 3 days.  Nonsmoker. EXAM: CHEST - 2 VIEW COMPARISON:  Radiographs 12/24/2018. FINDINGS: The heart size is stable at the upper limits of normal. There is aortic atherosclerosis and tortuosity. Compared with the prior study, there are lower lung volumes with increased diffuse subpleural reticulation, central airway thickening and architectural distortion. There is mild blunting of both costophrenic angles without significant pleural effusion or pneumothorax. There is a mild compression deformity near the thoracolumbar junction which does not appear acute. IMPRESSION: Lower lung volumes with increased diffuse subpleural reticulation, central airway thickening and architectural distortion. Findings may reflect progressive interstitial lung disease or superimposed atypical infection, including viral pneumonia. No focal airspace disease. Electronically Signed   By: Carey Bullocks M.D.   On: 01/10/2021 12:53   CT Angio Chest PE W  and/or Wo Contrast  Result Date: 01/10/2021 CLINICAL DATA:  Shortness of breath for 3 days. High clinical probability for pulmonary embolism. EXAM: CT ANGIOGRAPHY CHEST WITH CONTRAST TECHNIQUE: Multidetector CT imaging of the chest was performed using the standard protocol during bolus administration of intravenous contrast. Multiplanar CT image reconstructions and MIPs were obtained to evaluate the vascular anatomy. CONTRAST:  67mL OMNIPAQUE IOHEXOL 350 MG/ML SOLN COMPARISON:  None. FINDINGS: Cardiovascular: Satisfactory opacification of pulmonary arteries noted, and no pulmonary emboli identified. 4.1 cm aneurysm of the ascending thoracic aorta noted. Aortic and coronary atherosclerotic calcification noted. Mild cardiomegaly with left ventricular enlargement. Mediastinum/Nodes: No masses or pathologically enlarged lymph nodes identified. Lungs/Pleura: Diffuse interstitial infiltrates are seen, suspicious for interstitial edema. Small to moderate bilateral pleural effusions are also seen. No evidence of pulmonary consolidation or mass. Calcified pleural plaque is seen bilaterally, consistent with asbestos related pleural disease. Upper abdomen: No acute findings. Musculoskeletal: No suspicious bone lesions identified. Review of the MIP images confirms the above findings. IMPRESSION: No evidence of pulmonary embolism. Cardiomegaly, diffuse  interstitial infiltrates, bilateral pleural effusions, suspicious for congestive heart failure. Bilateral calcified pleural plaque, consistent with asbestos related pleural disease. 4.1 cm ascending thoracic aortic aneurysm. Recommend annual imaging followup by CTA or MRA. This recommendation follows 2010 ACCF/AHA/AATS/ACR/ASA/SCA/SCAI/SIR/STS/SVM Guidelines for the Diagnosis and Management of Patients with Thoracic Aortic Disease. Circulation. 2010; [id number]. Aortic aneurysm NOS (ICD10-I71.9) Aortic Atherosclerosis (ICD10-I70.0). Electronically Signed   By: Marlaine Hind  M.D.   On: 01/10/2021 14:33   ECHOCARDIOGRAM COMPLETE  Result Date: 01/11/2021    ECHOCARDIOGRAM REPORT   Patient Name:   Adam Bentley Date of Exam: 01/11/2021 Medical Rec #:  [id number]         Height:       68.0 in Accession #:    [id number]        Weight:       133.0 lb Date of Birth:  Sep 25, 1924         BSA:          1.718 m Patient Age:    53 years          BP:           129/83 mmHg Patient Gender: M                 HR:           71 bpm. Exam Location:  [facility] Procedure: 2D Echo, Color Doppler and Cardiac Doppler Indications:     [id number] CHF-Acute Systolic  History:         Patient has no prior history of Echocardiogram examinations.                  Risk Factors:Dyslipidemia.  Sonographer:     Charmayne Sheer RDCS (AE) Referring Phys:  Baker Janus Soledad Gerlach NIU Diagnosing Phys: Nelva Bush MD  Sonographer Comments: Suboptimal parasternal window. Image acquisition challenging due to patient body habitus. Global longitudinal strain was attempted. IMPRESSIONS  1. Left ventricular ejection fraction, by estimation, is 25 to 30%. The left ventricle has severely decreased function. The left ventricle demonstrates global hypokinesis. There is mild left ventricular hypertrophy. Left ventricular diastolic parameters  are consistent with Grade II diastolic dysfunction (pseudonormalization). Elevated left atrial pressure. The average left ventricular global longitudinal strain is -7.3 %. The global longitudinal strain is abnormal.  2. Right ventricular systolic function is low normal. The right ventricular size is normal. Tricuspid regurgitation signal is inadequate for assessing PA pressure.  3. Left atrial size was mildly dilated.  4. The mitral valve is degenerative. Mild to moderate mitral valve regurgitation. No evidence of mitral stenosis.  5. The aortic valve is tricuspid. There is moderate calcification of the aortic valve. There is moderate thickening of the aortic valve. Aortic valve regurgitation is mild to moderate.  Mild to moderate aortic valve sclerosis/calcification is present, without any evidence of aortic stenosis.  6. Mildly dilated pulmonary artery.  7. The inferior vena cava is dilated in size with <50% respiratory variability, suggesting right atrial pressure of 15 mmHg. FINDINGS  Left Ventricle: Left ventricular ejection fraction, by estimation, is 25 to 30%. The left ventricle has severely decreased function. The left ventricle demonstrates global hypokinesis. The average left ventricular global longitudinal strain is -7.3 %. The global longitudinal strain is abnormal. The left ventricular internal cavity size was normal in size. There is mild left ventricular hypertrophy. Left ventricular diastolic parameters are consistent with Grade II diastolic dysfunction (pseudonormalization). Elevated left atrial pressure. Right Ventricle: The right ventricular size is normal.  No increase in right ventricular wall thickness. Right ventricular systolic function is low normal. Tricuspid regurgitation signal is inadequate for assessing PA pressure. Left Atrium: Left atrial size was mildly dilated. Right Atrium: Right atrial size was normal in size. Pericardium: There is no evidence of pericardial effusion. Mitral Valve: The mitral valve is degenerative in appearance. There is mild thickening of the mitral valve leaflet(s). There is mild calcification of the mitral valve leaflet(s). Mild mitral annular calcification. Mild to moderate mitral valve regurgitation. No evidence of mitral valve stenosis. MV peak gradient, 2.2 mmHg. The mean mitral valve gradient is 1.0 mmHg. Tricuspid Valve: The tricuspid valve is not well visualized. Tricuspid valve regurgitation is mild. Aortic Valve: The aortic valve is tricuspid. There is moderate calcification of the aortic valve. There is moderate thickening of the aortic valve. Aortic valve regurgitation is mild to moderate. Aortic regurgitation PHT measures 421 msec. Mild to moderate aortic  valve sclerosis/calcification is present, without any evidence of aortic stenosis. Aortic valve mean gradient measures 2.0 mmHg. Aortic valve peak gradient measures 6.0 mmHg. Aortic valve area, by VTI measures 2.46 cm. Pulmonic Valve: The pulmonic valve was grossly normal. Pulmonic valve regurgitation is trivial. No evidence of pulmonic stenosis. Aorta: The aortic root and ascending aorta are structurally normal, with no evidence of dilitation. Pulmonary Artery: The pulmonary artery is mildly dilated. Venous: The inferior vena cava is dilated in size with less than 50% respiratory variability, suggesting right atrial pressure of 15 mmHg. IAS/Shunts: No atrial level shunt detected by color flow Doppler.  LEFT VENTRICLE PLAX 2D LVIDd:         5.45 cm  Diastology LVIDs:         4.45 cm  LV e' medial:    3.15 cm/s LV PW:         1.22 cm  LV E/e' medial:  20.8 LV IVS:        1.16 cm  LV e' lateral:   7.40 cm/s LVOT diam:     2.10 cm  LV E/e' lateral: 8.9 LV SV:         50 LV SV Index:   29       2D Longitudinal Strain LVOT Area:     3.46 cm 2D Strain GLS Avg:     -7.3 %  RIGHT VENTRICLE RV Basal diam:  3.28 cm TAPSE (M-mode): 1.9 cm LEFT ATRIUM             Index       RIGHT ATRIUM           Index LA diam:        5.00 cm 2.91 cm/m  RA Area:     15.20 cm LA Vol (A2C):   52.4 ml 30.49 ml/m RA Volume:   37.00 ml  21.53 ml/m LA Vol (A4C):   69.4 ml 40.39 ml/m LA Biplane Vol: 64.4 ml 37.48 ml/m  AORTIC VALVE                   PULMONIC VALVE AV Area (Vmax):    2.54 cm    PV Vmax:       0.90 m/s AV Area (Vmean):   2.84 cm    PV Vmean:      61.100 cm/s AV Area (VTI):     2.46 cm    PV VTI:        0.153 m AV Vmax:           122.00 cm/s PV  Peak grad:  3.3 mmHg AV Vmean:          72.100 cm/s PV Mean grad:  2.0 mmHg AV VTI:            0.201 m AV Peak Grad:      6.0 mmHg AV Mean Grad:      2.0 mmHg LVOT Vmax:         89.40 cm/s LVOT Vmean:        59.100 cm/s LVOT VTI:          0.143 m LVOT/AV VTI ratio: 0.71 AI PHT:             421 msec  AORTA Ao Root diam: 3.70 cm Ao Asc diam:  3.30 cm MITRAL VALVE MV Area (PHT): 4.93 cm    SHUNTS MV Area VTI:   2.39 cm    Systemic VTI:  0.14 m MV Peak grad:  2.2 mmHg    Systemic Diam: 2.10 cm MV Mean grad:  1.0 mmHg MV Vmax:       0.75 m/s MV Vmean:      50.8 cm/s MV Decel Time: 154 msec MV E velocity: 65.60 cm/s MV A velocity: 54.40 cm/s MV E/A ratio:  1.21 Harrell Gave End MD Electronically signed by Nelva Bush MD Signature Date/Time: 01/11/2021/10:16:33 AM    Final    Scheduled Meds: . aspirin EC  81 mg Oral Daily  . atorvastatin  40 mg Oral Daily  . carvedilol  3.125 mg Oral BID WC  . enoxaparin (LOVENOX) injection  40 mg Subcutaneous Q24H  . furosemide  20 mg Intravenous Q12H  . levothyroxine  100 mcg Oral Daily  . lisinopril  2.5 mg Oral Daily  . sodium chloride flush  3 mL Intravenous Q12H  . cyanocobalamin  1,000 mcg Oral Daily   Continuous Infusions: . sodium chloride       LOS: 1 day    Time spent: 35 mins    Franklin Clapsaddle, MD [facility]   If 7PM-7AM, please contact night-coverage

## 2021-01-12 ENCOUNTER — Encounter: Payer: Self-pay | Admitting: Internal Medicine

## 2021-01-12 DIAGNOSIS — I509 Heart failure, unspecified: Secondary | ICD-10-CM

## 2021-01-12 DIAGNOSIS — J9601 Acute respiratory failure with hypoxia: Secondary | ICD-10-CM | POA: Diagnosis not present

## 2021-01-12 DIAGNOSIS — I712 Thoracic aortic aneurysm, without rupture: Secondary | ICD-10-CM | POA: Diagnosis not present

## 2021-01-12 DIAGNOSIS — R778 Other specified abnormalities of plasma proteins: Secondary | ICD-10-CM | POA: Diagnosis not present

## 2021-01-12 LAB — CBC
HCT: 42.4 % (ref 39.0–52.0)
Hemoglobin: 14.2 g/dL (ref 13.0–17.0)
MCH: 33 pg (ref 26.0–34.0)
MCHC: 33.5 g/dL (ref 30.0–36.0)
MCV: 98.6 fL (ref 80.0–100.0)
Platelets: 211 10*3/uL (ref 150–400)
RBC: 4.3 MIL/uL (ref 4.22–5.81)
RDW: 14.6 % (ref 11.5–15.5)
WBC: 6.3 10*3/uL (ref 4.0–10.5)
nRBC: 0 % (ref 0.0–0.2)

## 2021-01-12 LAB — BASIC METABOLIC PANEL
Anion gap: 11 (ref 5–15)
BUN: 28 mg/dL — ABNORMAL HIGH (ref 8–23)
CO2: 26 mmol/L (ref 22–32)
Calcium: 9.7 mg/dL (ref 8.9–10.3)
Chloride: 101 mmol/L (ref 98–111)
Creatinine, Ser: 0.92 mg/dL (ref 0.61–1.24)
GFR, Estimated: >60 mL/min (ref >60)
Glucose, Bld: 90 mg/dL (ref 70–99)
Potassium: 3.9 mmol/L (ref 3.5–5.1)
Sodium: 138 mmol/L (ref 135–145)

## 2021-01-12 LAB — PHOSPHORUS: Phosphorus: 2.9 mg/dL (ref 2.5–4.6)

## 2021-01-12 LAB — MAGNESIUM: Magnesium: 2.3 mg/dL (ref 1.7–2.4)

## 2021-01-12 NOTE — Progress Notes (Signed)
Progress Note  Patient Name: Adam Bentley. Kontz Date of Encounter: 01/12/2021  [facility] Cardiologist: No primary care provider on file.   Subjective   Low UOP recorded overnight at -475mL although patient reports good urine output. No weight today. He can lay flat on exam. With new low EF plan for cardiac craterization, to which patient is agreeable.   Inpatient Medications    Scheduled Meds: . aspirin EC  81 mg Oral Daily  . atorvastatin  40 mg Oral Daily  . carvedilol  3.125 mg Oral BID WC  . enoxaparin (LOVENOX) injection  40 mg Subcutaneous Q24H  . furosemide  20 mg Intravenous Q12H  . levothyroxine  100 mcg Oral Daily  . lisinopril  2.5 mg Oral Daily  . sodium chloride flush  3 mL Intravenous Q12H  . cyanocobalamin  1,000 mcg Oral Daily   Continuous Infusions: . sodium chloride     PRN Meds: sodium chloride, acetaminophen, albuterol, dextromethorphan-guaiFENesin, ondansetron (ZOFRAN) IV, sodium chloride flush   Vital Signs    Vitals:   01/12/21 0400 01/12/21 0630 01/12/21 0832 01/12/21 0848  BP: 124/68 119/68 116/65 122/71  Pulse:  76 78   Resp:  20 18   Temp:      TempSrc:      SpO2:  97% 96%   Weight:      Height:        Intake/Output Summary (Last 24 hours) at 01/12/2021 1139 Last data filed at 01/12/2021 0713 Gross per 24 hour  Intake --  Output 800 ml  Net -800 ml   Last 3 Weights 01/10/2021 06/02/2020 05/27/2019  Weight (lbs) 133 lb 144 lb 155 lb  Weight (kg) 60.328 kg 65.318 kg 70.308 kg      Telemetry    SR, HR 60-70s, PACs, PVCs- Personally Reviewed  ECG    No new- Personally Reviewed  Physical Exam   GEN: No acute distress.   Neck: No JVD Cardiac: RRR, no murmurs, rubs, or gallops.  Respiratory: Clear to auscultation bilaterally. GI: Soft, nontender, non-distended  MS: No edema; No deformity. Neuro:  Nonfocal  Psych: Normal affect   Labs    High Sensitivity Troponin:   Recent Labs  Lab 01/10/21 1136 01/10/21 2017  01/11/21 0309  TROPONINIHS 213* 235* 240*      Chemistry Recent Labs  Lab 01/10/21 1136 01/11/21 0309 01/12/21 0645  NA 144 141 138  K 4.2 4.3 3.9  CL 105 105 101  CO2 27 27 26   GLUCOSE 100* 103* 90  BUN 32* 25* 28*  CREATININE 0.98 0.94 0.92  CALCIUM 10.4* 10.0 9.7  GFRNONAA >60 >60 >60  ANIONGAP 12 9 11      Hematology Recent Labs  Lab 01/10/21 1136 01/11/21 0309 01/12/21 0645  WBC 6.3 6.1 6.3  RBC 3.95* 4.19* 4.30  HGB 12.9* 13.7 14.2  HCT 40.2 42.1 42.4  MCV 101.8* 100.5* 98.6  MCH 32.7 32.7 33.0  MCHC 32.1 32.5 33.5  RDW 14.5 14.6 14.6  PLT 229 211 211    BNP Recent Labs  Lab 01/10/21 1135  BNP 1,927.9*     DDimer No results for input(s): DDIMER in the last 168 hours.   Radiology    DG Chest 2 View  Result Date: 01/10/2021 CLINICAL DATA:  Shortness of breath for 3 days.  Nonsmoker. EXAM: CHEST - 2 VIEW COMPARISON:  Radiographs 12/24/2018. FINDINGS: The heart size is stable at the upper limits of normal. There is aortic atherosclerosis and tortuosity. Compared with  the prior study, there are lower lung volumes with increased diffuse subpleural reticulation, central airway thickening and architectural distortion. There is mild blunting of both costophrenic angles without significant pleural effusion or pneumothorax. There is a mild compression deformity near the thoracolumbar junction which does not appear acute. IMPRESSION: Lower lung volumes with increased diffuse subpleural reticulation, central airway thickening and architectural distortion. Findings may reflect progressive interstitial lung disease or superimposed atypical infection, including viral pneumonia. No focal airspace disease. Electronically Signed   By: Richardean Sale M.D.   On: 01/10/2021 12:53   CT Angio Chest PE W and/or Wo Contrast  Result Date: 01/10/2021 CLINICAL DATA:  Shortness of breath for 3 days. High clinical probability for pulmonary embolism. EXAM: CT ANGIOGRAPHY CHEST WITH  CONTRAST TECHNIQUE: Multidetector CT imaging of the chest was performed using the standard protocol during bolus administration of intravenous contrast. Multiplanar CT image reconstructions and MIPs were obtained to evaluate the vascular anatomy. CONTRAST:  65mL OMNIPAQUE IOHEXOL 350 MG/ML SOLN COMPARISON:  None. FINDINGS: Cardiovascular: Satisfactory opacification of pulmonary arteries noted, and no pulmonary emboli identified. 4.1 cm aneurysm of the ascending thoracic aorta noted. Aortic and coronary atherosclerotic calcification noted. Mild cardiomegaly with left ventricular enlargement. Mediastinum/Nodes: No masses or pathologically enlarged lymph nodes identified. Lungs/Pleura: Diffuse interstitial infiltrates are seen, suspicious for interstitial edema. Small to moderate bilateral pleural effusions are also seen. No evidence of pulmonary consolidation or mass. Calcified pleural plaque is seen bilaterally, consistent with asbestos related pleural disease. Upper abdomen: No acute findings. Musculoskeletal: No suspicious bone lesions identified. Review of the MIP images confirms the above findings. IMPRESSION: No evidence of pulmonary embolism. Cardiomegaly, diffuse interstitial infiltrates, bilateral pleural effusions, suspicious for congestive heart failure. Bilateral calcified pleural plaque, consistent with asbestos related pleural disease. 4.1 cm ascending thoracic aortic aneurysm. Recommend annual imaging followup by CTA or MRA. This recommendation follows 2010 ACCF/AHA/AATS/ACR/ASA/SCA/SCAI/SIR/STS/SVM Guidelines for the Diagnosis and Management of Patients with Thoracic Aortic Disease. Circulation. 2010; 121: T517-O160. Aortic aneurysm NOS (ICD10-I71.9) Aortic Atherosclerosis (ICD10-I70.0). Electronically Signed   By: Marlaine Hind M.D.   On: 01/10/2021 14:33   ECHOCARDIOGRAM COMPLETE  Result Date: 01/11/2021    ECHOCARDIOGRAM REPORT   Patient Name:   Adam Bentley Date of Exam: 01/11/2021 Medical  Rec #:  [id number]         Height:       68.0 in Accession #:    [id number]        Weight:       133.0 lb Date of Birth:  June 11, 1924         BSA:          1.718 m Patient Age:    87 years          BP:           129/83 mmHg Patient Gender: M                 HR:           71 bpm. Exam Location:  [facility] Procedure: 2D Echo, Color Doppler and Cardiac Doppler Indications:     K09.38 CHF-Acute Systolic  History:         Patient has no prior history of Echocardiogram examinations.                  Risk Factors:Dyslipidemia.  Sonographer:     Charmayne Sheer RDCS (AE) Referring Phys:  Baker Janus Soledad Gerlach NIU Diagnosing Phys: Nelva Bush MD  Sonographer Comments: Suboptimal parasternal window.  Image acquisition challenging due to patient body habitus. Global longitudinal strain was attempted. IMPRESSIONS  1. Left ventricular ejection fraction, by estimation, is 25 to 30%. The left ventricle has severely decreased function. The left ventricle demonstrates global hypokinesis. There is mild left ventricular hypertrophy. Left ventricular diastolic parameters  are consistent with Grade II diastolic dysfunction (pseudonormalization). Elevated left atrial pressure. The average left ventricular global longitudinal strain is -7.3 %. The global longitudinal strain is abnormal.  2. Right ventricular systolic function is low normal. The right ventricular size is normal. Tricuspid regurgitation signal is inadequate for assessing PA pressure.  3. Left atrial size was mildly dilated.  4. The mitral valve is degenerative. Mild to moderate mitral valve regurgitation. No evidence of mitral stenosis.  5. The aortic valve is tricuspid. There is moderate calcification of the aortic valve. There is moderate thickening of the aortic valve. Aortic valve regurgitation is mild to moderate. Mild to moderate aortic valve sclerosis/calcification is present, without any evidence of aortic stenosis.  6. Mildly dilated pulmonary artery.  7. The inferior vena cava is  dilated in size with <50% respiratory variability, suggesting right atrial pressure of 15 mmHg. FINDINGS  Left Ventricle: Left ventricular ejection fraction, by estimation, is 25 to 30%. The left ventricle has severely decreased function. The left ventricle demonstrates global hypokinesis. The average left ventricular global longitudinal strain is -7.3 %. The global longitudinal strain is abnormal. The left ventricular internal cavity size was normal in size. There is mild left ventricular hypertrophy. Left ventricular diastolic parameters are consistent with Grade II diastolic dysfunction (pseudonormalization). Elevated left atrial pressure. Right Ventricle: The right ventricular size is normal. No increase in right ventricular wall thickness. Right ventricular systolic function is low normal. Tricuspid regurgitation signal is inadequate for assessing PA pressure. Left Atrium: Left atrial size was mildly dilated. Right Atrium: Right atrial size was normal in size. Pericardium: There is no evidence of pericardial effusion. Mitral Valve: The mitral valve is degenerative in appearance. There is mild thickening of the mitral valve leaflet(s). There is mild calcification of the mitral valve leaflet(s). Mild mitral annular calcification. Mild to moderate mitral valve regurgitation. No evidence of mitral valve stenosis. MV peak gradient, 2.2 mmHg. The mean mitral valve gradient is 1.0 mmHg. Tricuspid Valve: The tricuspid valve is not well visualized. Tricuspid valve regurgitation is mild. Aortic Valve: The aortic valve is tricuspid. There is moderate calcification of the aortic valve. There is moderate thickening of the aortic valve. Aortic valve regurgitation is mild to moderate. Aortic regurgitation PHT measures 421 msec. Mild to moderate aortic valve sclerosis/calcification is present, without any evidence of aortic stenosis. Aortic valve mean gradient measures 2.0 mmHg. Aortic valve peak gradient measures 6.0 mmHg.  Aortic valve area, by VTI measures 2.46 cm. Pulmonic Valve: The pulmonic valve was grossly normal. Pulmonic valve regurgitation is trivial. No evidence of pulmonic stenosis. Aorta: The aortic root and ascending aorta are structurally normal, with no evidence of dilitation. Pulmonary Artery: The pulmonary artery is mildly dilated. Venous: The inferior vena cava is dilated in size with less than 50% respiratory variability, suggesting right atrial pressure of 15 mmHg. IAS/Shunts: No atrial level shunt detected by color flow Doppler.  LEFT VENTRICLE PLAX 2D LVIDd:         5.45 cm  Diastology LVIDs:         4.45 cm  LV e' medial:    3.15 cm/s LV PW:         1.22 cm  LV E/e'  medial:  20.8 LV IVS:        1.16 cm  LV e' lateral:   7.40 cm/s LVOT diam:     2.10 cm  LV E/e' lateral: 8.9 LV SV:         50 LV SV Index:   29       2D Longitudinal Strain LVOT Area:     3.46 cm 2D Strain GLS Avg:     -7.3 %  RIGHT VENTRICLE RV Basal diam:  3.28 cm TAPSE (M-mode): 1.9 cm LEFT ATRIUM             Index       RIGHT ATRIUM           Index LA diam:        5.00 cm 2.91 cm/m  RA Area:     15.20 cm LA Vol (A2C):   52.4 ml 30.49 ml/m RA Volume:   37.00 ml  21.53 ml/m LA Vol (A4C):   69.4 ml 40.39 ml/m LA Biplane Vol: 64.4 ml 37.48 ml/m  AORTIC VALVE                   PULMONIC VALVE AV Area (Vmax):    2.54 cm    PV Vmax:       0.90 m/s AV Area (Vmean):   2.84 cm    PV Vmean:      61.100 cm/s AV Area (VTI):     2.46 cm    PV VTI:        0.153 m AV Vmax:           122.00 cm/s PV Peak grad:  3.3 mmHg AV Vmean:          72.100 cm/s PV Mean grad:  2.0 mmHg AV VTI:            0.201 m AV Peak Grad:      6.0 mmHg AV Mean Grad:      2.0 mmHg LVOT Vmax:         89.40 cm/s LVOT Vmean:        59.100 cm/s LVOT VTI:          0.143 m LVOT/AV VTI ratio: 0.71 AI PHT:            421 msec  AORTA Ao Root diam: 3.70 cm Ao Asc diam:  3.30 cm MITRAL VALVE MV Area (PHT): 4.93 cm    SHUNTS MV Area VTI:   2.39 cm    Systemic VTI:  0.14 m MV Peak grad:   2.2 mmHg    Systemic Diam: 2.10 cm MV Mean grad:  1.0 mmHg MV Vmax:       0.75 m/s MV Vmean:      50.8 cm/s MV Decel Time: 154 msec MV E velocity: 65.60 cm/s MV A velocity: 54.40 cm/s MV E/A ratio:  1.21 Nelva Bush MD Electronically signed by Nelva Bush MD Signature Date/Time: 01/11/2021/10:16:33 AM    Final     Cardiac Studies   Echo 01/11/21 1. Left ventricular ejection fraction, by estimation, is 25 to 30%. The  left ventricle has severely decreased function. The left ventricle  demonstrates global hypokinesis. There is mild left ventricular  hypertrophy. Left ventricular diastolic parameters  are consistent with Grade II diastolic dysfunction (pseudonormalization).  Elevated left atrial pressure. The average left ventricular global  longitudinal strain is -7.3 %. The global longitudinal strain is abnormal.  2. Right ventricular systolic function is low normal. The  right  ventricular size is normal. Tricuspid regurgitation signal is inadequate  for assessing PA pressure.  3. Left atrial size was mildly dilated.  4. The mitral valve is degenerative. Mild to moderate mitral valve  regurgitation. No evidence of mitral stenosis.  5. The aortic valve is tricuspid. There is moderate calcification of the  aortic valve. There is moderate thickening of the aortic valve. Aortic  valve regurgitation is mild to moderate. Mild to moderate aortic valve  sclerosis/calcification is present,  without any evidence of aortic stenosis.  6. Mildly dilated pulmonary artery.  7. The inferior vena cava is dilated in size with <50% respiratory  variability, suggesting right atrial pressure of 15 mmHg.   Patient Profile     87 y.o. male with a hx of HLD, hypothyroisdism, colon cancer, hearing loss, BPH, and diplopia who is being seen today for the evaluation of new onset heart failure.  Assessment & Plan    Acute respiratory failure New acute systolic heart failure New CM - presented  from SNF with a couple days of progressive sob and orthopnea. BNP elevated and CXR with interstitial edema. Imaging negative for PE. Kidney function wnl. - started on IV lasix 20mg  BID - started on lisinopril 2.5mg   - started on coreg 3.125mg  BID - echo showed reduced EF 25-30%, mild LVH, G2DD, low normal RV function, LA mildly dilated, mild to mod MR, mildly dilated pulmonary artery, dilated IVC with RAP 108mmHg - Patient did not appear to have great UOP only-457mL recorded overnight. He does not appear significantly volume overloaded on exam  - continue strict I/Os, daily weights, monitor creatinine with diuresis - Given reduced EF patient will need LHC. Will make NPO at midnight. Risks and benefits of cardiac catheterization have been discussed with the patient.  These include bleeding, infection, kidney damage, stroke, heart attack, death.  The patient understands these risks and is willing to proceed.  Elevated troponin - HS troponin 213>235>240 - no known CAD however has RF such as positive family history and HLD.  - chest CT with aortic and coronary atherosclerotic calcification. - No active chest pain  - started on Aspirin 81mg  daily, atorvastatin 40mg  daily, lisinopril 2.5mg  daily, and coreg - A1C 5.2 - Plan for ischemic evaluation with LHC   Hypothyroidism - continue synthroid  HLD - PTA not on medications - Lipid panel showed LDL 81, HDL 32, chol 126, TG 81 - started on atorvastatin 40mg  daily  Ascending aortic aneurysm - per CT this admission measuring at 4.1cm. Recommend annual imaging.   For questions or updates, please contact [place] Please consult [email or web address] for contact info under        Signed, Gatlyn Lipari Ninfa Meeker, PA-C  01/12/2021, 11:39 AM

## 2021-01-12 NOTE — Progress Notes (Signed)
PROGRESS NOTE    Adam Bentley  BZ:[id number] DOB: 10-May-1924 DOA: 01/10/2021 PCP: Venia Carbon, MD    Brief Narrative:  This 87 years old male with PMH significant for hyperlipidemia, hypothyroidism, colon cancer, hearing loss, BPH, diplopia who presents with acute shortness of breath.  Patient reports having shortness of breath for more than 3 days associated with orthopnea and generalized weakness.  Patient was found to be  desatting at 78% on room air which improved to 98% on rebreather on arrival.  He is found to have BNP 1927.  Chest x-ray shows interstitial edema,  CTA chest ruled out PE but showed cardiomegaly and bilateral interstitial edema.  Patient is admitted for new onset CHF, Cardiology consulted,  recommended aggressive diuresis, Patient may need ischemic evaluation given decrease EF on recent echocardiogram.     Consultants:   cardiology  Procedures:   Antimicrobials:       Subjective: Less sob. No cp  Objective: Vitals:   01/12/21 0400 01/12/21 0630 01/12/21 0832 01/12/21 0848  BP: 124/68 119/68 116/65 122/71  Pulse:  76 78   Resp:  20 18   Temp:      TempSrc:      SpO2:  97% 96%   Weight:      Height:        Intake/Output Summary (Last 24 hours) at 01/12/2021 1000 Last data filed at 01/12/2021 0713 Gross per 24 hour  Intake -  Output 800 ml  Net -800 ml   Filed Weights   01/10/21 1132  Weight: 60.3 kg    Examination:  General exam: Appears calm and comfortable  Respiratory system: scattered rales bl Cardiovascular system: S1 & S2 heard, RRR. No JVD, murmurs, rubs, gallops or clicks. Gastrointestinal system: Abdomen is nondistended, soft and nontender.  Normal bowel sounds heard. Central nervous system: Alert and oriented Extremities: no edema Psychiatry: Mood & affect appropriate in current setting.     Data Reviewed: I have personally reviewed following labs and imaging studies  CBC: Recent Labs  Lab 01/10/21 1136  01/11/21 0309 01/12/21 0645  WBC 6.3 6.1 6.3  HGB 12.9* 13.7 14.2  HCT 40.2 42.1 42.4  MCV 101.8* 100.5* 98.6  PLT 229 211 [id number]   Basic Metabolic Panel: Recent Labs  Lab 01/10/21 1136 01/11/21 0309 01/12/21 0645  NA 144 141 138  K 4.2 4.3 3.9  CL 105 105 101  CO2 27 27 26   GLUCOSE 100* 103* 90  BUN 32* 25* 28*  CREATININE 0.98 0.94 0.92  CALCIUM 10.4* 10.0 9.7  MG  --  2.2 2.3  PHOS  --   --  2.9   GFR: Estimated Creatinine Clearance: 41.9 mL/min (by C-G formula based on SCr of 0.92 mg/dL). Liver Function Tests: No results for input(s): AST, ALT, ALKPHOS, BILITOT, PROT, ALBUMIN in the last 168 hours. No results for input(s): LIPASE, AMYLASE in the last 168 hours. No results for input(s): AMMONIA in the last 168 hours. Coagulation Profile: No results for input(s): INR, PROTIME in the last 168 hours. Cardiac Enzymes: No results for input(s): CKTOTAL, CKMB, CKMBINDEX, TROPONINI in the last 168 hours. BNP (last 3 results) No results for input(s): PROBNP in the last 8760 hours. HbA1C: Recent Labs    01/11/21 0309  HGBA1C 5.2   CBG: No results for input(s): GLUCAP in the last 168 hours. Lipid Profile: Recent Labs    01/11/21 0309  CHOL 126  HDL 32*  LDLCALC 78  TRIG 81  CHOLHDL 3.9  Thyroid Function Tests: No results for input(s): TSH, T4TOTAL, FREET4, T3FREE, THYROIDAB in the last 72 hours. Anemia Panel: No results for input(s): VITAMINB12, FOLATE, FERRITIN, TIBC, IRON, RETICCTPCT in the last 72 hours. Sepsis Labs: No results for input(s): PROCALCITON, LATICACIDVEN in the last 168 hours.  Recent Results (from the past 240 hour(s))  Resp Panel by RT-PCR (Flu A&B, Covid) Nasopharyngeal Swab     Status: None   Collection Time: 01/10/21 11:36 AM   Specimen: Nasopharyngeal Swab; Nasopharyngeal(NP) swabs in vial transport medium  Result Value Ref Range Status   SARS Coronavirus 2 by RT PCR NEGATIVE NEGATIVE Final    Comment: (NOTE) SARS-CoV-2 target nucleic  acids are NOT DETECTED.  The SARS-CoV-2 RNA is generally detectable in upper respiratory specimens during the acute phase of infection. The lowest concentration of SARS-CoV-2 viral copies this assay can detect is 138 copies/mL. A negative result does not preclude SARS-Cov-2 infection and should not be used as the sole basis for treatment or other patient management decisions. A negative result may occur with  improper specimen collection/handling, submission of specimen other than nasopharyngeal swab, presence of viral mutation(s) within the areas targeted by this assay, and inadequate number of viral copies(<138 copies/mL). A negative result must be combined with clinical observations, patient history, and epidemiological information. The expected result is Negative.  Fact Sheet for Patients:  EntrepreneurPulse.com.au  Fact Sheet for Healthcare Providers:  IncredibleEmployment.be  This test is no t yet approved or cleared by the [place] FDA and  has been authorized for detection and/or diagnosis of SARS-CoV-2 by FDA under an Emergency Use Authorization (EUA). This EUA will remain  in effect (meaning this test can be used) for the duration of the COVID-19 declaration under Section 564(b)(1) of the Act, 21 U.S.C.section 360bbb-3(b)(1), unless the authorization is terminated  or revoked sooner.       Influenza A by PCR NEGATIVE NEGATIVE Final   Influenza B by PCR NEGATIVE NEGATIVE Final    Comment: (NOTE) The Xpert Xpress SARS-CoV-2/FLU/RSV plus assay is intended as an aid in the diagnosis of influenza from Nasopharyngeal swab specimens and should not be used as a sole basis for treatment. Nasal washings and aspirates are unacceptable for Xpert Xpress SARS-CoV-2/FLU/RSV testing.  Fact Sheet for Patients: EntrepreneurPulse.com.au  Fact Sheet for Healthcare Providers: IncredibleEmployment.be  This  test is not yet approved or cleared by the [place] FDA and has been authorized for detection and/or diagnosis of SARS-CoV-2 by FDA under an Emergency Use Authorization (EUA). This EUA will remain in effect (meaning this test can be used) for the duration of the COVID-19 declaration under Section 564(b)(1) of the Act, 21 U.S.C. section 360bbb-3(b)(1), unless the authorization is terminated or revoked.  Performed at [facility], [address], [place],  [address]          Radiology Studies: DG Chest 2 View  Result Date: 01/10/2021 CLINICAL DATA:  Shortness of breath for 3 days.  Nonsmoker. EXAM: CHEST - 2 VIEW COMPARISON:  Radiographs 12/24/2018. FINDINGS: The heart size is stable at the upper limits of normal. There is aortic atherosclerosis and tortuosity. Compared with the prior study, there are lower lung volumes with increased diffuse subpleural reticulation, central airway thickening and architectural distortion. There is mild blunting of both costophrenic angles without significant pleural effusion or pneumothorax. There is a mild compression deformity near the thoracolumbar junction which does not appear acute. IMPRESSION: Lower lung volumes with increased diffuse subpleural reticulation, central airway thickening and architectural  distortion. Findings may reflect progressive interstitial lung disease or superimposed atypical infection, including viral pneumonia. No focal airspace disease. Electronically Signed   By: Richardean Sale M.D.   On: 01/10/2021 12:53   CT Angio Chest PE W and/or Wo Contrast  Result Date: 01/10/2021 CLINICAL DATA:  Shortness of breath for 3 days. High clinical probability for pulmonary embolism. EXAM: CT ANGIOGRAPHY CHEST WITH CONTRAST TECHNIQUE: Multidetector CT imaging of the chest was performed using the standard protocol during bolus administration of intravenous contrast. Multiplanar CT image reconstructions and MIPs were obtained  to evaluate the vascular anatomy. CONTRAST:  24mL OMNIPAQUE IOHEXOL 350 MG/ML SOLN COMPARISON:  None. FINDINGS: Cardiovascular: Satisfactory opacification of pulmonary arteries noted, and no pulmonary emboli identified. 4.1 cm aneurysm of the ascending thoracic aorta noted. Aortic and coronary atherosclerotic calcification noted. Mild cardiomegaly with left ventricular enlargement. Mediastinum/Nodes: No masses or pathologically enlarged lymph nodes identified. Lungs/Pleura: Diffuse interstitial infiltrates are seen, suspicious for interstitial edema. Small to moderate bilateral pleural effusions are also seen. No evidence of pulmonary consolidation or mass. Calcified pleural plaque is seen bilaterally, consistent with asbestos related pleural disease. Upper abdomen: No acute findings. Musculoskeletal: No suspicious bone lesions identified. Review of the MIP images confirms the above findings. IMPRESSION: No evidence of pulmonary embolism. Cardiomegaly, diffuse interstitial infiltrates, bilateral pleural effusions, suspicious for congestive heart failure. Bilateral calcified pleural plaque, consistent with asbestos related pleural disease. 4.1 cm ascending thoracic aortic aneurysm. Recommend annual imaging followup by CTA or MRA. This recommendation follows 2010 ACCF/AHA/AATS/ACR/ASA/SCA/SCAI/SIR/STS/SVM Guidelines for the Diagnosis and Management of Patients with Thoracic Aortic Disease. Circulation. 2010; [id number]. Aortic aneurysm NOS (ICD10-I71.9) Aortic Atherosclerosis (ICD10-I70.0). Electronically Signed   By: Marlaine Hind M.D.   On: 01/10/2021 14:33   ECHOCARDIOGRAM COMPLETE  Result Date: 01/11/2021    ECHOCARDIOGRAM REPORT   Patient Name:   Adam Bentley Date of Exam: 01/11/2021 Medical Rec #:  [id number]         Height:       68.0 in Accession #:    [id number]        Weight:       133.0 lb Date of Birth:  [birth year]         BSA:          1.718 m Patient Age:    87 years          BP:            129/83 mmHg Patient Gender: M                 HR:           71 bpm. Exam Location:  [facility] Procedure: 2D Echo, Color Doppler and Cardiac Doppler Indications:     [id number] CHF-Acute Systolic  History:         Patient has no prior history of Echocardiogram examinations.                  Risk Factors:Dyslipidemia.  Sonographer:     Charmayne Sheer RDCS (AE) Referring Phys:  Baker Janus Soledad Gerlach NIU Diagnosing Phys: Nelva Bush MD  Sonographer Comments: Suboptimal parasternal window. Image acquisition challenging due to patient body habitus. Global longitudinal strain was attempted. IMPRESSIONS  1. Left ventricular ejection fraction, by estimation, is 25 to 30%. The left ventricle has severely decreased function. The left ventricle demonstrates global hypokinesis. There is mild left ventricular hypertrophy. Left ventricular diastolic parameters  are consistent with Grade II diastolic dysfunction (pseudonormalization). Elevated left atrial pressure.  The average left ventricular global longitudinal strain is -7.3 %. The global longitudinal strain is abnormal.  2. Right ventricular systolic function is low normal. The right ventricular size is normal. Tricuspid regurgitation signal is inadequate for assessing PA pressure.  3. Left atrial size was mildly dilated.  4. The mitral valve is degenerative. Mild to moderate mitral valve regurgitation. No evidence of mitral stenosis.  5. The aortic valve is tricuspid. There is moderate calcification of the aortic valve. There is moderate thickening of the aortic valve. Aortic valve regurgitation is mild to moderate. Mild to moderate aortic valve sclerosis/calcification is present, without any evidence of aortic stenosis.  6. Mildly dilated pulmonary artery.  7. The inferior vena cava is dilated in size with <50% respiratory variability, suggesting right atrial pressure of 15 mmHg. FINDINGS  Left Ventricle: Left ventricular ejection fraction, by estimation, is 25 to 30%. The left ventricle has  severely decreased function. The left ventricle demonstrates global hypokinesis. The average left ventricular global longitudinal strain is -7.3 %. The global longitudinal strain is abnormal. The left ventricular internal cavity size was normal in size. There is mild left ventricular hypertrophy. Left ventricular diastolic parameters are consistent with Grade II diastolic dysfunction (pseudonormalization). Elevated left atrial pressure. Right Ventricle: The right ventricular size is normal. No increase in right ventricular wall thickness. Right ventricular systolic function is low normal. Tricuspid regurgitation signal is inadequate for assessing PA pressure. Left Atrium: Left atrial size was mildly dilated. Right Atrium: Right atrial size was normal in size. Pericardium: There is no evidence of pericardial effusion. Mitral Valve: The mitral valve is degenerative in appearance. There is mild thickening of the mitral valve leaflet(s). There is mild calcification of the mitral valve leaflet(s). Mild mitral annular calcification. Mild to moderate mitral valve regurgitation. No evidence of mitral valve stenosis. MV peak gradient, 2.2 mmHg. The mean mitral valve gradient is 1.0 mmHg. Tricuspid Valve: The tricuspid valve is not well visualized. Tricuspid valve regurgitation is mild. Aortic Valve: The aortic valve is tricuspid. There is moderate calcification of the aortic valve. There is moderate thickening of the aortic valve. Aortic valve regurgitation is mild to moderate. Aortic regurgitation PHT measures 421 msec. Mild to moderate aortic valve sclerosis/calcification is present, without any evidence of aortic stenosis. Aortic valve mean gradient measures 2.0 mmHg. Aortic valve peak gradient measures 6.0 mmHg. Aortic valve area, by VTI measures 2.46 cm. Pulmonic Valve: The pulmonic valve was grossly normal. Pulmonic valve regurgitation is trivial. No evidence of pulmonic stenosis. Aorta: The aortic root and ascending  aorta are structurally normal, with no evidence of dilitation. Pulmonary Artery: The pulmonary artery is mildly dilated. Venous: The inferior vena cava is dilated in size with less than 50% respiratory variability, suggesting right atrial pressure of 15 mmHg. IAS/Shunts: No atrial level shunt detected by color flow Doppler.  LEFT VENTRICLE PLAX 2D LVIDd:         5.45 cm  Diastology LVIDs:         4.45 cm  LV e' medial:    3.15 cm/s LV PW:         1.22 cm  LV E/e' medial:  20.8 LV IVS:        1.16 cm  LV e' lateral:   7.40 cm/s LVOT diam:     2.10 cm  LV E/e' lateral: 8.9 LV SV:         50 LV SV Index:   29       2D Longitudinal Strain LVOT  Area:     3.46 cm 2D Strain GLS Avg:     -7.3 %  RIGHT VENTRICLE RV Basal diam:  3.28 cm TAPSE (M-mode): 1.9 cm LEFT ATRIUM             Index       RIGHT ATRIUM           Index LA diam:        5.00 cm 2.91 cm/m  RA Area:     15.20 cm LA Vol (A2C):   52.4 ml 30.49 ml/m RA Volume:   37.00 ml  21.53 ml/m LA Vol (A4C):   69.4 ml 40.39 ml/m LA Biplane Vol: 64.4 ml 37.48 ml/m  AORTIC VALVE                   PULMONIC VALVE AV Area (Vmax):    2.54 cm    PV Vmax:       0.90 m/s AV Area (Vmean):   2.84 cm    PV Vmean:      61.100 cm/s AV Area (VTI):     2.46 cm    PV VTI:        0.153 m AV Vmax:           122.00 cm/s PV Peak grad:  3.3 mmHg AV Vmean:          72.100 cm/s PV Mean grad:  2.0 mmHg AV VTI:            0.201 m AV Peak Grad:      6.0 mmHg AV Mean Grad:      2.0 mmHg LVOT Vmax:         89.40 cm/s LVOT Vmean:        59.100 cm/s LVOT VTI:          0.143 m LVOT/AV VTI ratio: 0.71 AI PHT:            421 msec  AORTA Ao Root diam: 3.70 cm Ao Asc diam:  3.30 cm MITRAL VALVE MV Area (PHT): 4.93 cm    SHUNTS MV Area VTI:   2.39 cm    Systemic VTI:  0.14 m MV Peak grad:  2.2 mmHg    Systemic Diam: 2.10 cm MV Mean grad:  1.0 mmHg MV Vmax:       0.75 m/s MV Vmean:      50.8 cm/s MV Decel Time: 154 msec MV E velocity: 65.60 cm/s MV A velocity: 54.40 cm/s MV E/A ratio:  1.21  Harrell Gave End MD Electronically signed by Nelva Bush MD Signature Date/Time: 01/11/2021/10:16:33 AM    Final         Scheduled Meds: . aspirin EC  81 mg Oral Daily  . atorvastatin  40 mg Oral Daily  . carvedilol  3.125 mg Oral BID WC  . enoxaparin (LOVENOX) injection  40 mg Subcutaneous Q24H  . furosemide  20 mg Intravenous Q12H  . levothyroxine  100 mcg Oral Daily  . lisinopril  2.5 mg Oral Daily  . sodium chloride flush  3 mL Intravenous Q12H  . cyanocobalamin  1,000 mcg Oral Daily   Continuous Infusions: . sodium chloride      Assessment & Plan:   Principal Problem:   Acute CHF (congestive heart failure) (HCC) Active Problems:   Hypothyroidism   HLD (hyperlipidemia)   Acute respiratory failure with hypoxia (HCC)   Ascending aortic aneurysm (HCC)   Elevated troponin   Acute hypoxic respiratory failuredue to acute systolic  HF (congestive heart failure): Patient presented with acute shortness of breath,  found to have O2 saturation of 78% on room air. Weaned down on 02. CT nega for PE Echo with EF 25 to 30% with global hypokinesis Cardiology following Continue IV Lasix Plan for cardiac cath on Friday  Elevated troponin: Could be secondary to demand ischemia. Cardiology does not think patient is having ACS [id number] with systolic dysfunction.  Plan for cardiac cath on Friday volume status is optimized Continue low-dose Coreg  Cardiomyopathy- ischemic v.s. nonischemic EF 25 to 30% on echo  plan for cardiac catheterization on Friday Continue Lasix, Coreg and lisinopril   Hypothyroidism Continue synthroid  HLD (hyperlipidemia): continue statin  Ascending aortic aneurysm ([place]):Incidental finding by CT angiogram, 1 cm ascending aortic aneurysm -Follow-up with PCP     DVT prophylaxis: Lovenox Code Status: DNR Family Communication: Updated niece [name]  Status is: Inpatient  Remains inpatient appropriate because:IV treatments appropriate  due to intensity of illness or inability to take PO   Dispo: The patient is from: SNF               Anticipated d/c is to: SNF              Anticipated d/c date is: 3 days              Patient currently is not medically stable to d/c.plan for cardiac cath on Friday once medically more stable.            LOS: 2 days   Time spent:45 min     Nolberto Hanlon, MD [facility] Pager [phone]  If 7PM-7AM, please contact night-coverage 01/12/2021, 10:00 AM

## 2021-01-12 NOTE — Consult Note (Signed)
   Heart Failure Nurse Navigator Note  HfrEF 25-30% global hypokinesis.  Mild left ventricular hypertrophy.  Grade 2 diastolic dysfunction.  Right ventricular systolic function is low normal.  Mildly dilated left atria mild to moderate mitral regurgitation.  Comorbidities:  Hyperlipidemia  Medications:  Aspirin 81 mg Atorvastatin 40 mg daily Coreg 3.125 mg twice a day Furosemide 20 mg IV every 12 Lisinopril 2.5 mg daily    Labs:  BNP 1927, sodium 138, potassium 3.9, chloride 101, CO2 26, BUN 28, creatinine 0.9, magnesium 2.3, hemoglobin 14.2 Pressure 107/96 Weight 60.3 kg BMI 20.2 to   Assessment:  General-he is awake and alert and on a gurney in the emergency room in no acute distress.  HEENT-pupils are equal, he wears bilateral hearing aids.  Cardiac- tones of regular rate and rhythm.  Chest- sounds diminished to the right base.  Abdomen-soft nontender.  Musculoskeletal-there is no lower extremity edema.  Neurologic-he is awake and alert speech is clear  Psych- makes good eye contact is pleasant and appropriate.    Initial visit with patient.  He states he quit smoking at age 28, has never seen a cardiologist before and lately was working on gaining weight as his physician felt that he was too thin for his frame.  States that he lives by himself with  his dog Anmoore.  States that he is one meal a day that is delivered and the other meals he fixes himself  convenience frozen food dinner.  Discussed reading labels and limiting sodium intake to less than 2000 mg in a 24-hour period.  He states that he weighs himself morning and night.  Explained that the import weight is the one that he gets first thing in the morning after he has emptied his bladder, and that he should record.  Discussed  signs and symptoms to report to physician.   States up until a couple weeks ago he was active he lives and was riding a stationary bike for approximately 30 minutes a day  did not have any trouble walking down to the area and then returning back to his apartment lately noted that he did not have the stamina to ride for much more than 3 to 5 minutes shortness of breath and fatigue.   He was given the living with heart failure teaching booklet, zone magnet and information about the outpatient heart failure clinic   Will continue to follow along.  Pricilla Riffle RN [facility]

## 2021-01-12 NOTE — ED Notes (Signed)
Given lunch

## 2021-01-13 DIAGNOSIS — R0902 Hypoxemia: Secondary | ICD-10-CM

## 2021-01-13 DIAGNOSIS — I248 Other forms of acute ischemic heart disease: Secondary | ICD-10-CM

## 2021-01-13 DIAGNOSIS — I712 Thoracic aortic aneurysm, without rupture: Secondary | ICD-10-CM | POA: Diagnosis not present

## 2021-01-13 DIAGNOSIS — J9601 Acute respiratory failure with hypoxia: Secondary | ICD-10-CM | POA: Diagnosis not present

## 2021-01-13 DIAGNOSIS — I5043 Acute on chronic combined systolic (congestive) and diastolic (congestive) heart failure: Secondary | ICD-10-CM | POA: Diagnosis not present

## 2021-01-13 DIAGNOSIS — J90 Pleural effusion, not elsewhere classified: Secondary | ICD-10-CM | POA: Diagnosis not present

## 2021-01-13 DIAGNOSIS — I5021 Acute systolic (congestive) heart failure: Secondary | ICD-10-CM | POA: Diagnosis not present

## 2021-01-13 DIAGNOSIS — R778 Other specified abnormalities of plasma proteins: Secondary | ICD-10-CM | POA: Diagnosis not present

## 2021-01-13 LAB — BASIC METABOLIC PANEL
Anion gap: 8 (ref 5–15)
BUN: 31 mg/dL — ABNORMAL HIGH (ref 8–23)
CO2: 29 mmol/L (ref 22–32)
Calcium: 9.7 mg/dL (ref 8.9–10.3)
Chloride: 98 mmol/L (ref 98–111)
Creatinine, Ser: 0.9 mg/dL (ref 0.61–1.24)
GFR, Estimated: >60 mL/min (ref >60)
Glucose, Bld: 84 mg/dL (ref 70–99)
Potassium: 3.8 mmol/L (ref 3.5–5.1)
Sodium: 135 mmol/L (ref 135–145)

## 2021-01-13 LAB — BRAIN NATRIURETIC PEPTIDE: B Natriuretic Peptide: 1450.6 pg/mL — ABNORMAL HIGH (ref 0.0–100.0)

## 2021-01-13 MED ORDER — FUROSEMIDE 10 MG/ML IJ SOLN
40.0000 mg | Freq: Two times a day (BID) | INTRAMUSCULAR | Status: DC
  Administered 2021-01-13 – 2021-01-15 (×4): 40 mg via INTRAVENOUS
  Filled 2021-01-13 (×4): qty 4

## 2021-01-13 MED ORDER — SODIUM CHLORIDE 0.9% FLUSH: 3.0000 mL | INTRAVENOUS | Status: DC | PRN

## 2021-01-13 MED ORDER — ASPIRIN 81 MG PO CHEW: 81.0000 mg | CHEWABLE_TABLET | ORAL | Status: DC

## 2021-01-13 MED ORDER — SODIUM CHLORIDE 0.9% FLUSH
3.0000 mL | Freq: Two times a day (BID) | INTRAVENOUS | Status: DC
  Administered 2021-01-13 – 2021-01-14 (×3): 3 mL via INTRAVENOUS

## 2021-01-13 MED ORDER — SODIUM CHLORIDE 0.9 % IV SOLN: 250.0000 mL | INTRAVENOUS | Status: DC | PRN

## 2021-01-13 MED ORDER — SODIUM CHLORIDE 0.9 % IV SOLN: INTRAVENOUS | Status: DC

## 2021-01-13 NOTE — Progress Notes (Addendum)
PROGRESS NOTE    Adam Bentley  QAS:[id number] DOB: 1924/09/02 DOA: 01/10/2021 PCP: Venia Carbon, MD    Brief Narrative:  This 87 years old male with PMH significant for hyperlipidemia, hypothyroidism, colon cancer, hearing loss, BPH, diplopia who presents with acute shortness of breath.  Patient reports having shortness of breath for more than 3 days associated with orthopnea and generalized weakness.  Patient was found to be  desatting at 78% on room air which improved to 98% on rebreather on arrival.  He is found to have BNP 1925.  Chest x-ray shows interstitial edema,  CTA chest ruled out PE but showed cardiomegaly and bilateral interstitial edema.  Patient is admitted for new onset CHF, Cardiology consulted,  recommended aggressive diuresis, Patient may need ischemic evaluation given decrease EF on recent echocardiogram.  1/20-feeling better today. Less sob. No cp   Consultants:   cardiology  Procedures:   Antimicrobials:       Subjective: As above  Objective: Vitals:   01/12/21 2300 01/13/21 0215 01/13/21 0500 01/13/21 0752  BP: 109/67  130/73 116/67  Pulse: (!) 57  (!) 57 (!) 59  Resp: 14  14 16   Temp: 98 F (36.7 C)  98 F (36.7 C) 98.3 F (36.8 C)  TempSrc: Oral  Oral Oral  SpO2: 97%  92% 94%  Weight:  61.6 kg    Height:        Intake/Output Summary (Last 24 hours) at 01/13/2021 0838 Last data filed at 01/13/2021 0600 Gross per 24 hour  Intake 240 ml  Output 400 ml  Net -160 ml   Filed Weights   01/10/21 1132 01/13/21 0215  Weight: 60.3 kg 61.6 kg    Examination: Calm, comfortable NAD Decrease bs at bases, no rales Regular s1/s2 no gallop Soft benign positive bowel sounds No edema Alert oriented x3, grossly intact    Data Reviewed: I have personally reviewed following labs and imaging studies  CBC: Recent Labs  Lab 01/10/21 1136 01/11/21 0309 01/12/21 0645  WBC 6.3 6.1 6.3  HGB 12.9* 13.7 14.2  HCT 40.2 42.1 42.4  MCV 101.8*  100.5* 98.6  PLT 229 211 798   Basic Metabolic Panel: Recent Labs  Lab 01/10/21 1136 01/11/21 0309 01/12/21 0645 01/13/21 0620  NA 144 141 138 135  K 4.2 4.3 3.9 3.8  CL 105 105 101 98  CO2 27 27 26 29   GLUCOSE 100* 103* 90 84  BUN 32* 25* 28* 31*  CREATININE 0.98 0.94 0.92 0.90  CALCIUM 10.4* 10.0 9.7 9.7  MG  --  2.2 2.3  --   PHOS  --   --  2.9  --    GFR: Estimated Creatinine Clearance: 43.7 mL/min (by C-G formula based on SCr of 0.9 mg/dL). Liver Function Tests: No results for input(s): AST, ALT, ALKPHOS, BILITOT, PROT, ALBUMIN in the last 168 hours. No results for input(s): LIPASE, AMYLASE in the last 168 hours. No results for input(s): AMMONIA in the last 168 hours. Coagulation Profile: No results for input(s): INR, PROTIME in the last 168 hours. Cardiac Enzymes: No results for input(s): CKTOTAL, CKMB, CKMBINDEX, TROPONINI in the last 168 hours. BNP (last 3 results) No results for input(s): PROBNP in the last 8760 hours. HbA1C: Recent Labs    01/11/21 0309  HGBA1C 5.2   CBG: No results for input(s): GLUCAP in the last 168 hours. Lipid Profile: Recent Labs    01/11/21 0309  CHOL 126  HDL 32*  LDLCALC 78  TRIG 81  CHOLHDL 3.9   Thyroid Function Tests: No results for input(s): TSH, T4TOTAL, FREET4, T3FREE, THYROIDAB in the last 72 hours. Anemia Panel: No results for input(s): VITAMINB12, FOLATE, FERRITIN, TIBC, IRON, RETICCTPCT in the last 72 hours. Sepsis Labs: No results for input(s): PROCALCITON, LATICACIDVEN in the last 168 hours.  Recent Results (from the past 240 hour(s))  Resp Panel by RT-PCR (Flu A&B, Covid) Nasopharyngeal Swab     Status: None   Collection Time: 01/10/21 11:36 AM   Specimen: Nasopharyngeal Swab; Nasopharyngeal(NP) swabs in vial transport medium  Result Value Ref Range Status   SARS Coronavirus 2 by RT PCR NEGATIVE NEGATIVE Final    Comment: (NOTE) SARS-CoV-2 target nucleic acids are NOT DETECTED.  The SARS-CoV-2 RNA is  generally detectable in upper respiratory specimens during the acute phase of infection. The lowest concentration of SARS-CoV-2 viral copies this assay can detect is 138 copies/mL. A negative result does not preclude SARS-Cov-2 infection and should not be used as the sole basis for treatment or other patient management decisions. A negative result may occur with  improper specimen collection/handling, submission of specimen other than nasopharyngeal swab, presence of viral mutation(s) within the areas targeted by this assay, and inadequate number of viral copies(<138 copies/mL). A negative result must be combined with clinical observations, patient history, and epidemiological information. The expected result is Negative.  Fact Sheet for Patients:  EntrepreneurPulse.com.au  Fact Sheet for Healthcare Providers:  IncredibleEmployment.be  This test is no t yet approved or cleared by the [place] FDA and  has been authorized for detection and/or diagnosis of SARS-CoV-2 by FDA under an Emergency Use Authorization (EUA). This EUA will remain  in effect (meaning this test can be used) for the duration of the COVID-19 declaration under Section 564(b)(1) of the Act, 21 U.S.C.section 360bbb-3(b)(1), unless the authorization is terminated  or revoked sooner.       Influenza A by PCR NEGATIVE NEGATIVE Final   Influenza B by PCR NEGATIVE NEGATIVE Final    Comment: (NOTE) The Xpert Xpress SARS-CoV-2/FLU/RSV plus assay is intended as an aid in the diagnosis of influenza from Nasopharyngeal swab specimens and should not be used as a sole basis for treatment. Nasal washings and aspirates are unacceptable for Xpert Xpress SARS-CoV-2/FLU/RSV testing.  Fact Sheet for Patients: EntrepreneurPulse.com.au  Fact Sheet for Healthcare Providers: IncredibleEmployment.be  This test is not yet approved or cleared by the [place] FDA and has been authorized for detection and/or diagnosis of SARS-CoV-2 by FDA under an Emergency Use Authorization (EUA). This EUA will remain in effect (meaning this test can be used) for the duration of the COVID-19 declaration under Section 564(b)(1) of the Act, 21 U.S.C. section 360bbb-3(b)(1), unless the authorization is terminated or revoked.  Performed at [facility], [address] [place], [place] [address]          Radiology Studies: ECHOCARDIOGRAM COMPLETE  Result Date: 01/11/2021    ECHOCARDIOGRAM REPORT   Patient Name:   [name] Date of Exam: 01/11/2021 Medical Rec #:  [id number]         Height:       68.0 in Accession #:    [id number]        Weight:       133.0 lb Date of Birth:  12-08-1924         BSA:          1.718 m Patient Age:    35 years  BP:           129/83 mmHg Patient Gender: M                 HR:           71 bpm. Exam Location:  [facility] Procedure: 2D Echo, Color Doppler and Cardiac Doppler Indications:     [id number] CHF-Acute Systolic  History:         Patient has no prior history of Echocardiogram examinations.                  Risk Factors:Dyslipidemia.  Sonographer:     Charmayne Sheer RDCS (AE) Referring Phys:  Baker Janus Soledad Gerlach NIU Diagnosing Phys: Nelva Bush MD  Sonographer Comments: Suboptimal parasternal window. Image acquisition challenging due to patient body habitus. Global longitudinal strain was attempted. IMPRESSIONS  1. Left ventricular ejection fraction, by estimation, is 25 to 30%. The left ventricle has severely decreased function. The left ventricle demonstrates global hypokinesis. There is mild left ventricular hypertrophy. Left ventricular diastolic parameters  are consistent with Grade II diastolic dysfunction (pseudonormalization). Elevated left atrial pressure. The average left ventricular global longitudinal strain is -7.3 %. The global longitudinal strain is abnormal.  2. Right ventricular systolic function is low  normal. The right ventricular size is normal. Tricuspid regurgitation signal is inadequate for assessing PA pressure.  3. Left atrial size was mildly dilated.  4. The mitral valve is degenerative. Mild to moderate mitral valve regurgitation. No evidence of mitral stenosis.  5. The aortic valve is tricuspid. There is moderate calcification of the aortic valve. There is moderate thickening of the aortic valve. Aortic valve regurgitation is mild to moderate. Mild to moderate aortic valve sclerosis/calcification is present, without any evidence of aortic stenosis.  6. Mildly dilated pulmonary artery.  7. The inferior vena cava is dilated in size with <50% respiratory variability, suggesting right atrial pressure of 15 mmHg. FINDINGS  Left Ventricle: Left ventricular ejection fraction, by estimation, is 25 to 30%. The left ventricle has severely decreased function. The left ventricle demonstrates global hypokinesis. The average left ventricular global longitudinal strain is -7.3 %. The global longitudinal strain is abnormal. The left ventricular internal cavity size was normal in size. There is mild left ventricular hypertrophy. Left ventricular diastolic parameters are consistent with Grade II diastolic dysfunction (pseudonormalization). Elevated left atrial pressure. Right Ventricle: The right ventricular size is normal. No increase in right ventricular wall thickness. Right ventricular systolic function is low normal. Tricuspid regurgitation signal is inadequate for assessing PA pressure. Left Atrium: Left atrial size was mildly dilated. Right Atrium: Right atrial size was normal in size. Pericardium: There is no evidence of pericardial effusion. Mitral Valve: The mitral valve is degenerative in appearance. There is mild thickening of the mitral valve leaflet(s). There is mild calcification of the mitral valve leaflet(s). Mild mitral annular calcification. Mild to moderate mitral valve regurgitation. No evidence of  mitral valve stenosis. MV peak gradient, 2.2 mmHg. The mean mitral valve gradient is 1.0 mmHg. Tricuspid Valve: The tricuspid valve is not well visualized. Tricuspid valve regurgitation is mild. Aortic Valve: The aortic valve is tricuspid. There is moderate calcification of the aortic valve. There is moderate thickening of the aortic valve. Aortic valve regurgitation is mild to moderate. Aortic regurgitation PHT measures 421 msec. Mild to moderate aortic valve sclerosis/calcification is present, without any evidence of aortic stenosis. Aortic valve mean gradient measures 2.0 mmHg. Aortic valve peak gradient measures 6.0 mmHg. Aortic valve area, by VTI  measures 2.46 cm. Pulmonic Valve: The pulmonic valve was grossly normal. Pulmonic valve regurgitation is trivial. No evidence of pulmonic stenosis. Aorta: The aortic root and ascending aorta are structurally normal, with no evidence of dilitation. Pulmonary Artery: The pulmonary artery is mildly dilated. Venous: The inferior vena cava is dilated in size with less than 50% respiratory variability, suggesting right atrial pressure of 15 mmHg. IAS/Shunts: No atrial level shunt detected by color flow Doppler.  LEFT VENTRICLE PLAX 2D LVIDd:         5.45 cm  Diastology LVIDs:         4.45 cm  LV e' medial:    3.15 cm/s LV PW:         1.22 cm  LV E/e' medial:  20.8 LV IVS:        1.16 cm  LV e' lateral:   7.40 cm/s LVOT diam:     2.10 cm  LV E/e' lateral: 8.9 LV SV:         50 LV SV Index:   29       2D Longitudinal Strain LVOT Area:     3.46 cm 2D Strain GLS Avg:     -7.3 %  RIGHT VENTRICLE RV Basal diam:  3.28 cm TAPSE (M-mode): 1.9 cm LEFT ATRIUM             Index       RIGHT ATRIUM           Index LA diam:        5.00 cm 2.91 cm/m  RA Area:     15.20 cm LA Vol (A2C):   52.4 ml 30.49 ml/m RA Volume:   37.00 ml  21.53 ml/m LA Vol (A4C):   69.4 ml 40.39 ml/m LA Biplane Vol: 64.4 ml 37.48 ml/m  AORTIC VALVE                   PULMONIC VALVE AV Area (Vmax):    2.54 cm     PV Vmax:       0.90 m/s AV Area (Vmean):   2.84 cm    PV Vmean:      61.100 cm/s AV Area (VTI):     2.46 cm    PV VTI:        0.153 m AV Vmax:           122.00 cm/s PV Peak grad:  3.3 mmHg AV Vmean:          72.100 cm/s PV Mean grad:  2.0 mmHg AV VTI:            0.201 m AV Peak Grad:      6.0 mmHg AV Mean Grad:      2.0 mmHg LVOT Vmax:         89.40 cm/s LVOT Vmean:        59.100 cm/s LVOT VTI:          0.143 m LVOT/AV VTI ratio: 0.71 AI PHT:            421 msec  AORTA Ao Root diam: 3.70 cm Ao Asc diam:  3.30 cm MITRAL VALVE MV Area (PHT): 4.93 cm    SHUNTS MV Area VTI:   2.39 cm    Systemic VTI:  0.14 m MV Peak grad:  2.2 mmHg    Systemic Diam: 2.10 cm MV Mean grad:  1.0 mmHg MV Vmax:       0.75 m/s MV Vmean:  50.8 cm/s MV Decel Time: 154 msec MV E velocity: 65.60 cm/s MV A velocity: 54.40 cm/s MV E/A ratio:  1.21 Nelva Bush MD Electronically signed by Nelva Bush MD Signature Date/Time: 01/11/2021/10:16:33 AM    Final         Scheduled Meds: . Derrill Memo ON 01/14/2021] aspirin  81 mg Oral Pre-Cath  . aspirin EC  81 mg Oral Daily  . atorvastatin  40 mg Oral Daily  . carvedilol  3.125 mg Oral BID WC  . enoxaparin (LOVENOX) injection  40 mg Subcutaneous Q24H  . furosemide  20 mg Intravenous Q12H  . levothyroxine  100 mcg Oral Daily  . lisinopril  2.5 mg Oral Daily  . sodium chloride flush  3 mL Intravenous Q12H  . sodium chloride flush  3 mL Intravenous Q12H  . cyanocobalamin  1,000 mcg Oral Daily   Continuous Infusions: . sodium chloride    . sodium chloride    . [START ON 01/14/2021] sodium chloride      Assessment & Plan:   Principal Problem:   Acute CHF (congestive heart failure) (HCC) Active Problems:   Hypothyroidism   HLD (hyperlipidemia)   Acute respiratory failure with hypoxia (HCC)   Ascending aortic aneurysm (HCC)   Elevated troponin   Acute hypoxic respiratory failuredue to acute systolic HF (congestive heart failure): Patient presented with acute  shortness of breath,  found to have O2 saturation of 78% on room air. Weaned down on 02. CT nega for PE Echo with EF 25 to 30% with global hypokinesis 1/20-patient more euvolemic on exam today. O2 sat above 92% on room air BMP mildly less than on admission Plan was to do left heart cath but cardiology after speaking with patient and family have decided not to pursue this and continue medical management Continue IV Lasix Cardiology following   Elevated troponin: Could be secondary to demand ischemia. Cardiology does not think patient is having ACS 1/20-cp free. No plans for LHC as above. Continue medical regimen Continue beta-blockers, statin, aspirin   Cardiomyopathy- ischemic v.s. nonischemic EF 25 to 30% on echo  1/20-No plans for [facility] Medical regimen with IV Lasix, lisinopril, Coreg    Hypothyroidism Continue synthroid  HLD (hyperlipidemia): continue statin  Ascending aortic aneurysm ([place]):Incidental finding by CT angiogram, 1 cm ascending aortic aneurysm -Follow-up with PCP     DVT prophylaxis: Lovenox Code Status: DNR Family Communication: None at bedside  Status is: Inpatient  Remains inpatient appropriate because:IV treatments appropriate due to intensity of illness or inability to take PO   Dispo: The patient is from: SNF               Anticipated d/c is to: SNF              Anticipated d/c date is: 3 days              Patient currently is not medically stable to d/c. Patient still needs IV Lasix           LOS: 3 days   Time spent:35 min with >50% on coc    Nolberto Hanlon, MD [facility] Pager [phone]  If 7PM-7AM, please contact night-coverage 01/13/2021, 8:38 AM

## 2021-01-13 NOTE — Progress Notes (Signed)
Progress Note  Patient Name: Adam Bentley. Heilman Date of Encounter: 01/13/2021  Primary Cardiologist: Nelva Bush, MD  Subjective   No chest pain.  Breathing much improved.  Inpatient Medications    Scheduled Meds: . [START ON 01/14/2021] aspirin  81 mg Oral Pre-Cath  . aspirin EC  81 mg Oral Daily  . atorvastatin  40 mg Oral Daily  . carvedilol  3.125 mg Oral BID WC  . enoxaparin (LOVENOX) injection  40 mg Subcutaneous Q24H  . levothyroxine  100 mcg Oral Daily  . lisinopril  2.5 mg Oral Daily  . sodium chloride flush  3 mL Intravenous Q12H  . sodium chloride flush  3 mL Intravenous Q12H  . cyanocobalamin  1,000 mcg Oral Daily   Continuous Infusions: . sodium chloride    . sodium chloride    . [START ON 01/14/2021] sodium chloride     PRN Meds: sodium chloride, sodium chloride, acetaminophen, albuterol, dextromethorphan-guaiFENesin, ondansetron (ZOFRAN) IV, sodium chloride flush, sodium chloride flush   Vital Signs    Vitals:   01/12/21 2300 01/13/21 0215 01/13/21 0500 01/13/21 0752  BP: 109/67  130/73 116/67  Pulse: (!) 57  (!) 57 (!) 59  Resp: 14  14 16   Temp: 98 F (36.7 C)  98 F (36.7 C) 98.3 F (36.8 C)  TempSrc: Oral  Oral Oral  SpO2: 97%  92% 94%  Weight:  61.6 kg    Height:        Intake/Output Summary (Last 24 hours) at 01/13/2021 1459 Last data filed at 01/13/2021 1401 Gross per 24 hour  Intake 840 ml  Output 400 ml  Net 440 ml   Filed Weights   01/10/21 1132 01/13/21 0215  Weight: 60.3 kg 61.6 kg    Physical Exam   GEN: Well nourished, well developed, in no acute distress.  HEENT: Grossly normal.  Neck: Supple, no JVD, carotid bruits, or masses. Cardiac: RRR, no murmurs, rubs, or gallops. No clubbing, cyanosis, edema.  Respiratory:  Respirations regular and unlabored, clear to auscultation bilaterally. GI: Soft, nontender, nondistended, BS + x 4. MS: no deformity or atrophy. Skin: warm and dry, no rash. Neuro:  Strength and  sensation are intact. Psych: AAOx3.  Normal affect.  Labs    Chemistry Recent Labs  Lab 01/11/21 0309 01/12/21 0645 01/13/21 0620  NA 141 138 135  K 4.3 3.9 3.8  CL 105 101 98  CO2 27 26 29   GLUCOSE 103* 90 84  BUN 25* 28* 31*  CREATININE 0.94 0.92 0.90  CALCIUM 10.0 9.7 9.7  GFRNONAA >60 >60 >60  ANIONGAP 9 11 8      Hematology Recent Labs  Lab 01/10/21 1136 01/11/21 0309 01/12/21 0645  WBC 6.3 6.1 6.3  RBC 3.95* 4.19* 4.30  HGB 12.9* 13.7 14.2  HCT 40.2 42.1 42.4  MCV 101.8* 100.5* 98.6  MCH 32.7 32.7 33.0  MCHC 32.1 32.5 33.5  RDW 14.5 14.6 14.6  PLT 229 211 211    Cardiac Enzymes  Recent Labs  Lab 01/10/21 1136 01/10/21 2017 01/11/21 0309  TROPONINIHS 213* 235* 240*      BNP Recent Labs  Lab 01/10/21 1135 01/13/21 0520  BNP 1,927.9* 1,450.6*     Lipids  Lab Results  Component Value Date   CHOL 126 01/11/2021   HDL 32 (L) 01/11/2021   LDLCALC 78 01/11/2021   TRIG 81 01/11/2021   CHOLHDL 3.9 01/11/2021    HbA1c  Lab Results  Component Value Date   HGBA1C  5.2 01/11/2021    Radiology    DG Chest 2 View  Result Date: 01/10/2021 CLINICAL DATA:  Shortness of breath for 3 days.  Nonsmoker. EXAM: CHEST - 2 VIEW COMPARISON:  Radiographs 12/24/2018. FINDINGS: The heart size is stable at the upper limits of normal. There is aortic atherosclerosis and tortuosity. Compared with the prior study, there are lower lung volumes with increased diffuse subpleural reticulation, central airway thickening and architectural distortion. There is mild blunting of both costophrenic angles without significant pleural effusion or pneumothorax. There is a mild compression deformity near the thoracolumbar junction which does not appear acute. IMPRESSION: Lower lung volumes with increased diffuse subpleural reticulation, central airway thickening and architectural distortion. Findings may reflect progressive interstitial lung disease or superimposed atypical infection,  including viral pneumonia. No focal airspace disease. Electronically Signed   By: Richardean Sale M.D.   On: 01/10/2021 12:53   CT Angio Chest PE W and/or Wo Contrast  Result Date: 01/10/2021 CLINICAL DATA:  Shortness of breath for 3 days. High clinical probability for pulmonary embolism. EXAM: CT ANGIOGRAPHY CHEST WITH CONTRAST TECHNIQUE: Multidetector CT imaging of the chest was performed using the standard protocol during bolus administration of intravenous contrast. Multiplanar CT image reconstructions and MIPs were obtained to evaluate the vascular anatomy. CONTRAST:  24mL OMNIPAQUE IOHEXOL 350 MG/ML SOLN COMPARISON:  None. FINDINGS: Cardiovascular: Satisfactory opacification of pulmonary arteries noted, and no pulmonary emboli identified. 4.1 cm aneurysm of the ascending thoracic aorta noted. Aortic and coronary atherosclerotic calcification noted. Mild cardiomegaly with left ventricular enlargement. Mediastinum/Nodes: No masses or pathologically enlarged lymph nodes identified. Lungs/Pleura: Diffuse interstitial infiltrates are seen, suspicious for interstitial edema. Small to moderate bilateral pleural effusions are also seen. No evidence of pulmonary consolidation or mass. Calcified pleural plaque is seen bilaterally, consistent with asbestos related pleural disease. Upper abdomen: No acute findings. Musculoskeletal: No suspicious bone lesions identified. Review of the MIP images confirms the above findings. IMPRESSION: No evidence of pulmonary embolism. Cardiomegaly, diffuse interstitial infiltrates, bilateral pleural effusions, suspicious for congestive heart failure. Bilateral calcified pleural plaque, consistent with asbestos related pleural disease. 4.1 cm ascending thoracic aortic aneurysm. Recommend annual imaging followup by CTA or MRA. This recommendation follows 2010 ACCF/AHA/AATS/ACR/ASA/SCA/SCAI/SIR/STS/SVM Guidelines for the Diagnosis and Management of Patients with Thoracic Aortic Disease.  Circulation. 2010; 121: O277-A128. Aortic aneurysm NOS (ICD10-I71.9) Aortic Atherosclerosis (ICD10-I70.0). Electronically Signed   By: Marlaine Hind M.D.   On: 01/10/2021 14:33    Telemetry    RSR - Personally Reviewed  Cardiac Studies   2D Echocardiogram 1.2022  Echo 01/11/21  1. Left ventricular ejection fraction, by estimation, is 25 to 30%. The  left ventricle has severely decreased function. The left ventricle  demonstrates global hypokinesis. There is mild left ventricular  hypertrophy. Left ventricular diastolic parameters   are consistent with Grade II diastolic dysfunction (pseudonormalization).  Elevated left atrial pressure. The average left ventricular global  longitudinal strain is -7.3 %. The global longitudinal strain is abnormal.   2. Right ventricular systolic function is low normal. The right  ventricular size is normal. Tricuspid regurgitation signal is inadequate  for assessing PA pressure.   3. Left atrial size was mildly dilated.   4. The mitral valve is degenerative. Mild to moderate mitral valve  regurgitation. No evidence of mitral stenosis.   5. The aortic valve is tricuspid. There is moderate calcification of the  aortic valve. There is moderate thickening of the aortic valve. Aortic  valve regurgitation is mild to moderate. Mild to  moderate aortic valve  sclerosis/calcification is present,  without any evidence of aortic stenosis.   6. Mildly dilated pulmonary artery.   7. The inferior vena cava is dilated in size with <50% respiratory  variability, suggesting right atrial pressure of 15 mmHg.  Daphene Jaeger   Patient Profile     87 y.o. male with a hx of HLD, hypothyroisdism, colon cancer, hearingloss, BPH, anddiplopiawho is being seen today for the evaluation of new onset heart failure.  Assessment & Plan    1.  Acute heart failure with reduced ejection fraction: Patient without prior cardiac history who was admitted January 17 with a 1 week history of  progressive dyspnea on exertion.  He was found to be volume overloaded.  Echocardiogram on the 18th showed an EF of 25 to 30% with global hypokinesis, mild to moderate MR and AI.  Patient has diuresed well  and was -560 mL yesterday.  BNP on admission 1927.9.  Slightly lower at 1450.6 today.  BUN up slightly with diuresis to 31 today.  Creatinine stable at 0.90.  Bicarb up slightly at 29.  He appears relatively euvolemic on examination.  Will cont IV lasix this afternoon and readdress in the AM. Initially, there was discussion regarding pursuit of diagnostic catheterization however after further discussion with the patient, his power of attorney (nephew Delfino Lovett in [place]), and his primary care provider, we have collectively agreed to continue conservative medical therapy.  He remains on aspirin, statin, beta-blocker, and ACE inhibitor therapy.  Pressure stable in the 1 teens this morning though were variable throughout the day yesterday, frequently in the low 100s.  In that setting, I will not titrate his medications or add MRA at this time.    2.  Demand ischemia: Patient with mild troponin elevation (213  235  240) in the setting of above.  He has not had any chest pain.  As above, based on discussion with family and primary care provider, we will continue conservative medical therapy (aspirin, beta-blocker, and statin therapy).  Given the high likelihood of severe CAD, he would benefit from palliative care evaluation and consideration for DNR status.  3.  Hyperlipidemia: LDL of 78.  Cont statin rx.  Signed, Murray Hodgkins, NP  01/13/2021, 2:59 PM    For questions or updates, please contact   Please consult [email or web address] for contact info under Cardiology/STEMI.

## 2021-01-13 NOTE — Progress Notes (Signed)
Physical Therapy Treatment Patient Details Name: Adam Bentley. Adam Bentley MRN: [id number] DOB: Apr 03, 1924 Today's Date: 01/13/2021    History of Present Illness 87 y.o. male with medical history significant of hyperlipidemia, hypothyroidism, colon cancer, hearing loss, BPH, diplopia, who presents with shortness of breath for more than 3 days, which has been progressively worsening. Negative for PE, covid.    PT Comments    Pt eager to get up and do some activity.  He was able to ambulate >100 ft with walker, but did have inconsistent cadence, forward lean and showed poor awareness with walker use and positioning.  He had some mild fatigue with the effort.  O2 reading during the effort was inconsistent but when sensors did read they were generally in the mid/low 80s.  On return to room we tried to get a better reading w/o a lot of success.  Pt reported feeling only minimally tired and did not have overt shortness of breath.  After a few minutes to recover we did a few light LE exercises in supine, but again O2 sats were inconsistent and we agreed to not over-do-it and further exercises deferred.  Pt glad to have been up and do some ambulation.  Follow Up Recommendations  Home health PT     Equipment Recommendations  None recommended by PT    Recommendations for Other Services       Precautions / Restrictions Precautions Precautions: Fall Restrictions Weight Bearing Restrictions: No    Mobility  Bed Mobility Overal bed mobility: Modified Independent             General bed mobility comments: Pt was able to get LEs to EOB with slow but unassisted effort, similarly he needed a little extra time to get LEs back into bed but managed w/o assist  Transfers Overall transfer level: Modified independent Equipment used: Rolling walker (2 wheeled)             General transfer comment: Pt with more reliance on walker today than on eval 2 days ago.  Still safe and w/o much  hesitation.  Ambulation/Gait Ambulation/Gait assistance: Supervision Gait Distance (Feet): 110 Feet Assistive device: Rolling walker (2 wheeled)       General Gait Details: Pt with confident ambulation, but had multiple epsidoses of getting outside of the walker and leaning forward too far.  Even with cuing he did not readily make appropriate adjustments.  Good overall effort but did show some mild safety concerns.  No overt LOBs but poor awareness.  Difficult to get consitent O2 readings and sats did say 70s at one point.  Tried to confirm sats with PT's pulse ox both it and the tele sensor were reading in the mid 80s but again both were tenuous at best (much of the time not reading at all despite efforts to encourage peripheral blood flow).  Pt did not have excessive shortness of breath/DOE though he did endorse some fatigue.  HR did not seeme to get higher than 80, nursing notified of odd/inconsistent vitals.   Stairs             Wheelchair Mobility    Modified Rankin (Stroke Patients Only)       Balance Overall balance assessment: Needs assistance Sitting-balance support: No upper extremity supported Sitting balance-Leahy Scale: Good     Standing balance support: Bilateral upper extremity supported Standing balance-Leahy Scale: Fair Standing balance comment: leaning on walker, no LOBs but forward leaning and showing some unsteadiness  Cognition Arousal/Alertness: Awake/alert Behavior During Therapy: WFL for tasks assessed/performed Overall Cognitive Status: Within Functional Limits for tasks assessed                                        Exercises General Exercises - Lower Extremity Heel Slides: Strengthening;10 reps (with resisted leg extensions) Hip ABduction/ADduction: AROM;5 reps Straight Leg Raises: AROM;5 reps    General Comments        Pertinent Vitals/Pain Pain Assessment: No/denies pain     Home Living                      Prior Function            PT Goals (current goals can now be found in the care plan section) Progress towards PT goals: Progressing toward goals    Frequency    Min 2X/week      PT Plan Current plan remains appropriate    Co-evaluation              AM-PAC PT "6 Clicks" Mobility   Outcome Measure  Help needed turning from your back to your side while in a flat bed without using bedrails?: None Help needed moving from lying on your back to sitting on the side of a flat bed without using bedrails?: None Help needed moving to and from a bed to a chair (including a wheelchair)?: None Help needed standing up from a chair using your arms (e.g., wheelchair or bedside chair)?: None Help needed to walk in hospital room?: A Little Help needed climbing 3-5 steps with a railing? : A Little 6 Click Score: 22    End of Session Equipment Utilized During Treatment: Gait belt Activity Tolerance: Patient tolerated treatment well Patient left: with call bell/phone within reach;with bed alarm set Nurse Communication: Mobility status;Other (comment) (inconsistent O2 readings) PT Visit Diagnosis: Muscle weakness (generalized) (M62.81);Difficulty in walking, not elsewhere classified (R26.2)     Time: 1007-1219 PT Time Calculation (min) (ACUTE ONLY): 25 min  Charges:  $Gait Training: 8-22 mins $Therapeutic Exercise: 8-22 mins                     Kreg Shropshire, DPT 01/13/2021, 5:06 PM

## 2021-01-14 ENCOUNTER — Encounter
Admission: EM | Disposition: A | Discharge status: Nursing Home (Includes ICF) | Payer: Self-pay | Source: Home / Self Care | Attending: Internal Medicine

## 2021-01-14 ENCOUNTER — Telehealth: Payer: Self-pay | Admitting: Internal Medicine

## 2021-01-14 DIAGNOSIS — J9601 Acute respiratory failure with hypoxia: Secondary | ICD-10-CM | POA: Diagnosis not present

## 2021-01-14 DIAGNOSIS — R778 Other specified abnormalities of plasma proteins: Secondary | ICD-10-CM | POA: Diagnosis not present

## 2021-01-14 DIAGNOSIS — I712 Thoracic aortic aneurysm, without rupture: Secondary | ICD-10-CM | POA: Diagnosis not present

## 2021-01-14 DIAGNOSIS — I5021 Acute systolic (congestive) heart failure: Secondary | ICD-10-CM | POA: Diagnosis not present

## 2021-01-14 LAB — BASIC METABOLIC PANEL
Anion gap: 9 (ref 5–15)
BUN: 38 mg/dL — ABNORMAL HIGH (ref 8–23)
CO2: 30 mmol/L (ref 22–32)
Calcium: 10 mg/dL (ref 8.9–10.3)
Chloride: 93 mmol/L — ABNORMAL LOW (ref 98–111)
Creatinine, Ser: 1.16 mg/dL (ref 0.61–1.24)
GFR, Estimated: 58 mL/min — ABNORMAL LOW (ref >60)
Glucose, Bld: 102 mg/dL — ABNORMAL HIGH (ref 70–99)
Potassium: 4.2 mmol/L (ref 3.5–5.1)
Sodium: 132 mmol/L — ABNORMAL LOW (ref 135–145)

## 2021-01-14 SURGERY — CORONARY ANGIOGRAPHY (CATH LAB)
Anesthesia: Moderate Sedation | Laterality: Left

## 2021-01-14 NOTE — Telephone Encounter (Signed)
Detailed message left on his identified voice mail

## 2021-01-14 NOTE — Care Management Important Message (Signed)
Important Message  Patient Details  Name: Adam Bentley. Cavell MRN: [id number] Date of Birth: 1924/01/18   Medicare Important Message Given:  Yes     [name] 01/14/2021, 1:50 PM

## 2021-01-14 NOTE — Progress Notes (Signed)
SATURATION QUALIFICATIONS: (This note is used to comply with regulatory documentation for home oxygen)  Patient Saturations on Room Air at Rest = 98%  Patient Saturations on Room Air while Ambulating = Mid-high 80s (PLeth reading incosistent during ambulation with average reading 85-88%  MD notified via secure chat.

## 2021-01-14 NOTE — Telephone Encounter (Signed)
Pt neice called in wanted to know if Dr. Silvio Pate can call her brother so they can all be on the same page due to he is going to rehab. The brother name [name] Penny phone number [phone]

## 2021-01-14 NOTE — Progress Notes (Addendum)
PROGRESS NOTE    Adam Bentley  MPN:[id number] DOB: [birth year] DOA: 01/10/2021 PCP: Karie Schwalbe, MD    Brief Narrative:  This 87 years old male with PMH significant for hyperlipidemia, hypothyroidism, colon cancer, hearing loss, BPH, diplopia who presents with acute shortness of breath.  Patient reports having shortness of breath for more than 3 days associated with orthopnea and generalized weakness.  Patient was found to be  desatting at 78% on room air which improved to 98% on rebreather on arrival.  He is found to have BNP 1927.  Chest x-ray shows interstitial edema,  CTA chest ruled out PE but showed cardiomegaly and bilateral interstitial edema.  Patient is admitted for new onset CHF, Cardiology consulted,  recommended aggressive diuresis, Patient may need ischemic evaluation given decrease EF on recent echocardiogram.  1/20-feeling better today. Less sob. No cp 1/21-feeling better. No cp. Yesterday with PT 02 sat dropped to 80's/ plz see PT note.  Consultants:   cardiology  Procedures:   Antimicrobials:       Subjective: Less sob, no cp  Objective: Vitals:   01/14/21 0723 01/14/21 1033 01/14/21 1119 01/14/21 1605  BP: 129/75 114/64 (!) 144/79 106/61  Pulse: 65 67 71 (!) 57  Resp: 19  17 17   Temp: (!) 97.5 F (36.4 C)  97.9 F (36.6 C) 97.9 F (36.6 C)  TempSrc: Oral     SpO2: 99%  96% 98%  Weight:      Height:        Intake/Output Summary (Last 24 hours) at 01/14/2021 1611 Last data filed at 01/14/2021 1500 Gross per 24 hour  Intake 1440 ml  Output 1620 ml  Net -180 ml   Filed Weights   01/10/21 1132 01/13/21 0215 01/14/21 0500  Weight: 60.3 kg 61.6 kg 63 kg    Examination: Calm, comfortable NAD Minimal scattered bilateral rales  Regular s1/s2 no gallop Soft benign +bs No edema b/l Awake and alert grossly intact Mood and affect appropriate     Data Reviewed: I have personally reviewed following labs and imaging studies  CBC: Recent  Labs  Lab 01/10/21 1136 01/11/21 0309 01/12/21 0645  WBC 6.3 6.1 6.3  HGB 12.9* 13.7 14.2  HCT 40.2 42.1 42.4  MCV 101.8* 100.5* 98.6  PLT 229 211 211   Basic Metabolic Panel: Recent Labs  Lab 01/10/21 1136 01/11/21 0309 01/12/21 0645 01/13/21 0620 01/14/21 0414  NA 144 141 138 135 132*  K 4.2 4.3 3.9 3.8 4.2  CL 105 105 101 98 93*  CO2 27 27 26 29 30   GLUCOSE 100* 103* 90 84 102*  BUN 32* 25* 28* 31* 38*  CREATININE 0.98 0.94 0.92 0.90 1.16  CALCIUM 10.4* 10.0 9.7 9.7 10.0  MG  --  2.2 2.3  --   --   PHOS  --   --  2.9  --   --    GFR: Estimated Creatinine Clearance: 34.7 mL/min (by C-G formula based on SCr of 1.16 mg/dL). Liver Function Tests: No results for input(s): AST, ALT, ALKPHOS, BILITOT, PROT, ALBUMIN in the last 168 hours. No results for input(s): LIPASE, AMYLASE in the last 168 hours. No results for input(s): AMMONIA in the last 168 hours. Coagulation Profile: No results for input(s): INR, PROTIME in the last 168 hours. Cardiac Enzymes: No results for input(s): CKTOTAL, CKMB, CKMBINDEX, TROPONINI in the last 168 hours. BNP (last 3 results) No results for input(s): PROBNP in the last 8760 hours. HbA1C: No results for  input(s): HGBA1C in the last 72 hours. CBG: No results for input(s): GLUCAP in the last 168 hours. Lipid Profile: No results for input(s): CHOL, HDL, LDLCALC, TRIG, CHOLHDL, LDLDIRECT in the last 72 hours. Thyroid Function Tests: No results for input(s): TSH, T4TOTAL, FREET4, T3FREE, THYROIDAB in the last 72 hours. Anemia Panel: No results for input(s): VITAMINB12, FOLATE, FERRITIN, TIBC, IRON, RETICCTPCT in the last 72 hours. Sepsis Labs: No results for input(s): PROCALCITON, LATICACIDVEN in the last 168 hours.  Recent Results (from the past 240 hour(s))  Resp Panel by RT-PCR (Flu A&B, Covid) Nasopharyngeal Swab     Status: None   Collection Time: 01/10/21 11:36 AM   Specimen: Nasopharyngeal Swab; Nasopharyngeal(NP) swabs in vial  transport medium  Result Value Ref Range Status   SARS Coronavirus 2 by RT PCR NEGATIVE NEGATIVE Final    Comment: (NOTE) SARS-CoV-2 target nucleic acids are NOT DETECTED.  The SARS-CoV-2 RNA is generally detectable in upper respiratory specimens during the acute phase of infection. The lowest concentration of SARS-CoV-2 viral copies this assay can detect is 138 copies/mL. A negative result does not preclude SARS-Cov-2 infection and should not be used as the sole basis for treatment or other patient management decisions. A negative result may occur with  improper specimen collection/handling, submission of specimen other than nasopharyngeal swab, presence of viral mutation(s) within the areas targeted by this assay, and inadequate number of viral copies(<138 copies/mL). A negative result must be combined with clinical observations, patient history, and epidemiological information. The expected result is Negative.  Fact Sheet for Patients:  EntrepreneurPulse.com.au  Fact Sheet for Healthcare Providers:  IncredibleEmployment.be  This test is no t yet approved or cleared by the [place] FDA and  has been authorized for detection and/or diagnosis of SARS-CoV-2 by FDA under an Emergency Use Authorization (EUA). This EUA will remain  in effect (meaning this test can be used) for the duration of the COVID-19 declaration under Section 564(b)(1) of the Act, 21 U.S.C.section 360bbb-3(b)(1), unless the authorization is terminated  or revoked sooner.       Influenza A by PCR NEGATIVE NEGATIVE Final   Influenza B by PCR NEGATIVE NEGATIVE Final    Comment: (NOTE) The Xpert Xpress SARS-CoV-2/FLU/RSV plus assay is intended as an aid in the diagnosis of influenza from Nasopharyngeal swab specimens and should not be used as a sole basis for treatment. Nasal washings and aspirates are unacceptable for Xpert Xpress SARS-CoV-2/FLU/RSV testing.  Fact  Sheet for Patients: EntrepreneurPulse.com.au  Fact Sheet for Healthcare Providers: IncredibleEmployment.be  This test is not yet approved or cleared by the [place] FDA and has been authorized for detection and/or diagnosis of SARS-CoV-2 by FDA under an Emergency Use Authorization (EUA). This EUA will remain in effect (meaning this test can be used) for the duration of the COVID-19 declaration under Section 564(b)(1) of the Act, 21 U.S.C. section 360bbb-3(b)(1), unless the authorization is terminated or revoked.  Performed at [facility], [address], [place], [place] [address]          Radiology Studies: No results found.      Scheduled Meds: . aspirin EC  81 mg Oral Daily  . atorvastatin  40 mg Oral Daily  . carvedilol  3.125 mg Oral BID WC  . enoxaparin (LOVENOX) injection  40 mg Subcutaneous Q24H  . furosemide  40 mg Intravenous BID  . levothyroxine  100 mcg Oral Daily  . lisinopril  2.5 mg Oral Daily  . sodium chloride flush  3 mL Intravenous Q12H  . cyanocobalamin  1,000 mcg Oral Daily   Continuous Infusions: . sodium chloride      Assessment & Plan:   Principal Problem:   Acute CHF (congestive heart failure) (HCC) Active Problems:   Hypothyroidism   HLD (hyperlipidemia)   Acute respiratory failure with hypoxia (HCC)   Ascending aortic aneurysm (HCC)   Elevated troponin   Acute hypoxic respiratory failuredue to acute systolic HF (congestive heart failure): Patient presented with acute shortness of breath,  found to have O2 saturation of 78% on room air. Weaned down on 02. CT nega for PE Echo with EF 25 to 30% with global hypokinesis 1/21-improved, mildly volume overloaded need to ck ambulating 02 as he dropped to low/mid 80's with PT yesterday, had nsg to ck today Cards following, will continue IV Lasix 40 twice daily for today and possible switch to oral Lasix tomorrow  Patient and family  opted for conservative therapy instead of cardiac catheterization given his age which is reasonable We will continue ACE and carvedilol Palliative consulted, pending    Elevated troponin: Could be secondary to demand ischemia. Cardiology does not think patient is having ACS 1/21-cp free. Continue medical therapy with beta blk, statin, asa   Cardiomyopathy- ischemic v.s. nonischemic EF 25 to 30% on echo  1/21-no plans for LHC Continue iv lasix , ACEI, coreg    Hypothyroidism Continue synthroid  HLD (hyperlipidemia): continue statin  Ascending aortic aneurysm ([place]):Incidental finding by CT angiogram, 1 cm ascending aortic aneurysm -Follow-up with PCP     DVT prophylaxis: Lovenox Code Status: DNR Family Communication: None at bedside  Status is: Inpatient  Remains inpatient appropriate because:IV treatments appropriate due to intensity of illness or inability to take PO   Dispo: The patient is from: SNF               Anticipated d/c is to: SNF              Anticipated d/c date is: 1-2 days              Patient currently is not medically stable to d/c. Patient still needs IV Lasix           LOS: 4 days   Time spent:35 min with >50% on coc    Nolberto Hanlon, MD [facility] Pager [phone]  If 7PM-7AM, please contact night-coverage 01/14/2021, 4:11 PM

## 2021-01-14 NOTE — Progress Notes (Signed)
Progress Note  Patient Name: Adam Bentley. Grosso Date of Encounter: 01/14/2021  [facility] Cardiologist: Nelva Bush, MD   Subjective   After discussion with patient and family no plan for LHC. Recorded UOP low however patient reports good urine output. Weights also not accurate. No chest pain. Breathing stable.  Inpatient Medications    Scheduled Meds: . aspirin  81 mg Oral Pre-Cath  . aspirin EC  81 mg Oral Daily  . atorvastatin  40 mg Oral Daily  . carvedilol  3.125 mg Oral BID WC  . enoxaparin (LOVENOX) injection  40 mg Subcutaneous Q24H  . furosemide  40 mg Intravenous BID  . levothyroxine  100 mcg Oral Daily  . lisinopril  2.5 mg Oral Daily  . sodium chloride flush  3 mL Intravenous Q12H  . sodium chloride flush  3 mL Intravenous Q12H  . cyanocobalamin  1,000 mcg Oral Daily   Continuous Infusions: . sodium chloride    . sodium chloride    . sodium chloride     PRN Meds: sodium chloride, sodium chloride, acetaminophen, albuterol, dextromethorphan-guaiFENesin, ondansetron (ZOFRAN) IV, sodium chloride flush, sodium chloride flush   Vital Signs    Vitals:   01/13/21 1944 01/14/21 0500 01/14/21 0723 01/14/21 1033  BP: 113/63  129/75 114/64  Pulse: (!) 56  65 67  Resp: 18  19   Temp: 97.7 F (36.5 C)  (!) 97.5 F (36.4 C)   TempSrc: Oral  Oral   SpO2: 92%  99%   Weight:  63 kg    Height:        Intake/Output Summary (Last 24 hours) at 01/14/2021 1112 Last data filed at 01/14/2021 1000 Gross per 24 hour  Intake 1440 ml  Output 770 ml  Net 670 ml   Last 3 Weights 01/14/2021 01/13/2021 01/10/2021  Weight (lbs) 138 lb 14.2 oz 135 lb 12.9 oz 133 lb  Weight (kg) 63 kg 61.6 kg 60.328 kg      Telemetry    NSR, PACs, PVCs, HR 50-60s - Personally Reviewed  ECG    No new - Personally Reviewed  Physical Exam   GEN: No acute distress.   Neck: No JVD Cardiac: RRR, no murmurs, rubs, or gallops.  Respiratory: Clear to auscultation bilaterally. GI: Soft,  nontender, non-distended  MS: No edema; No deformity. Neuro:  Nonfocal  Psych: Normal affect   Labs    High Sensitivity Troponin:   Recent Labs  Lab 01/10/21 1136 01/10/21 2017 01/11/21 0309  TROPONINIHS 213* 235* 240*      Chemistry Recent Labs  Lab 01/12/21 0645 01/13/21 0620 01/14/21 0414  NA 138 135 132*  K 3.9 3.8 4.2  CL 101 98 93*  CO2 26 29 30   GLUCOSE 90 84 102*  BUN 28* 31* 38*  CREATININE 0.92 0.90 1.16  CALCIUM 9.7 9.7 10.0  GFRNONAA >60 >60 58*  ANIONGAP 11 8 9      Hematology Recent Labs  Lab 01/10/21 1136 01/11/21 0309 01/12/21 0645  WBC 6.3 6.1 6.3  RBC 3.95* 4.19* 4.30  HGB 12.9* 13.7 14.2  HCT 40.2 42.1 42.4  MCV 101.8* 100.5* 98.6  MCH 32.7 32.7 33.0  MCHC 32.1 32.5 33.5  RDW 14.5 14.6 14.6  PLT 229 211 211    BNP Recent Labs  Lab 01/10/21 1135 01/13/21 0520  BNP 1,927.9* 1,450.6*     DDimer No results for input(s): DDIMER in the last 168 hours.   Radiology    No results found.  Cardiac Studies    Echo 01/11/21 1. Left ventricular ejection fraction, by estimation, is 25 to 30%. The  left ventricle has severely decreased function. The left ventricle  demonstrates global hypokinesis. There is mild left ventricular  hypertrophy. Left ventricular diastolic parameters  are consistent with Grade II diastolic dysfunction (pseudonormalization).  Elevated left atrial pressure. The average left ventricular global  longitudinal strain is -7.3 %. The global longitudinal strain is abnormal.  2. Right ventricular systolic function is low normal. The right  ventricular size is normal. Tricuspid regurgitation signal is inadequate  for assessing PA pressure.  3. Left atrial size was mildly dilated.  4. The mitral valve is degenerative. Mild to moderate mitral valve  regurgitation. No evidence of mitral stenosis.  5. The aortic valve is tricuspid. There is moderate calcification of the  aortic valve. There is moderate thickening  of the aortic valve. Aortic  valve regurgitation is mild to moderate. Mild to moderate aortic valve  sclerosis/calcification is present,  without any evidence of aortic stenosis.  6. Mildly dilated pulmonary artery.  7. The inferior vena cava is dilated in size with <50% respiratory  variability, suggesting right atrial pressure of 15 mmHg.   Patient Profile     87 y.o. male with a hx of HLD, hypothyroisdism, colon cancer, hearingloss, BPH, anddiplopiawho is being seen today for the evaluation of new onset heart failure.  Assessment & Plan    Acute systolic heart failure - patient presented with 1 week of progressive dyspnea. Echo showed reduced EF 30-35%, mild ot mod MR, global hypokinesis.  - IV lasix 40 mg BID - Recorded UOP is very low, net is very minimal as well. Weights not accurate. Patient reports good UOP. - BNP improved since admission, 1900>1400 - After conversation with patient and family plan is for conservative management. No plan for cath. Recommended palliative care consult with discussion of code status - continue BB and ACE - Appears euvolemic on exam  Demand ischemia - no prior ischemic work-up - HS troponin 213>235>240 - No chest pain reported - plan to continue conservative management, no plan for heart cath - continue aspirin, BB, statin  HLD - LDL 78 - continue statin  For questions or updates, please contact [facility] Please consult [email or web address] for contact info under        Signed, [place] Ninfa Meeker, PA-C  01/14/2021, 11:12 AM

## 2021-01-15 DIAGNOSIS — I5021 Acute systolic (congestive) heart failure: Secondary | ICD-10-CM | POA: Diagnosis not present

## 2021-01-15 DIAGNOSIS — R778 Other specified abnormalities of plasma proteins: Secondary | ICD-10-CM | POA: Diagnosis not present

## 2021-01-15 DIAGNOSIS — J9601 Acute respiratory failure with hypoxia: Secondary | ICD-10-CM | POA: Diagnosis not present

## 2021-01-15 DIAGNOSIS — I712 Thoracic aortic aneurysm, without rupture: Secondary | ICD-10-CM | POA: Diagnosis not present

## 2021-01-15 LAB — BASIC METABOLIC PANEL
Anion gap: 9 (ref 5–15)
BUN: 35 mg/dL — ABNORMAL HIGH (ref 8–23)
CO2: 29 mmol/L (ref 22–32)
Calcium: 9.7 mg/dL (ref 8.9–10.3)
Chloride: 96 mmol/L — ABNORMAL LOW (ref 98–111)
Creatinine, Ser: 0.88 mg/dL (ref 0.61–1.24)
GFR, Estimated: >60 mL/min (ref >60)
Glucose, Bld: 95 mg/dL (ref 70–99)
Potassium: 3.7 mmol/L (ref 3.5–5.1)
Sodium: 134 mmol/L — ABNORMAL LOW (ref 135–145)

## 2021-01-15 MED ORDER — POTASSIUM CHLORIDE CRYS ER 20 MEQ PO TBCR
20.0000 meq | EXTENDED_RELEASE_TABLET | Freq: Every day | ORAL | Status: DC
  Administered 2021-01-15 – 2021-01-18 (×4): 20 meq via ORAL
  Filled 2021-01-15 (×4): qty 1

## 2021-01-15 MED ORDER — FUROSEMIDE 40 MG PO TABS
40.0000 mg | ORAL_TABLET | Freq: Every day | ORAL | Status: DC
  Administered 2021-01-15 – 2021-01-18 (×4): 40 mg via ORAL
  Filled 2021-01-15 (×4): qty 1

## 2021-01-15 NOTE — Progress Notes (Addendum)
Progress Note  Patient Name: Adam Bentley. Gong Date of Encounter: 01/15/2021  Primary Cardiologist: End  Subjective   He feels well today. No chest pain or dyspnea. Up sitting in bed eating lunch. Reports he is able to sleep fully supine. Documented UOP 440 mL for the past 24 hours with a net - 820 mL for the admission. Weight 63 to 59.7 kg for the past 24 hours. Renal function stable. With ambulation with PT on 1/21, his oxygen saturations dropped to 80s.  Inpatient Medications    Scheduled Meds: . aspirin EC  81 mg Oral Daily  . atorvastatin  40 mg Oral Daily  . carvedilol  3.125 mg Oral BID WC  . enoxaparin (LOVENOX) injection  40 mg Subcutaneous Q24H  . furosemide  40 mg Intravenous BID  . levothyroxine  100 mcg Oral Daily  . lisinopril  2.5 mg Oral Daily  . sodium chloride flush  3 mL Intravenous Q12H  . cyanocobalamin  1,000 mcg Oral Daily   Continuous Infusions: . sodium chloride     PRN Meds: sodium chloride, acetaminophen, albuterol, dextromethorphan-guaiFENesin, ondansetron (ZOFRAN) IV, sodium chloride flush   Vital Signs    Vitals:   01/14/21 1829 01/14/21 1942 01/15/21 0413 01/15/21 0836  BP: 126/74 137/82 123/66 106/72  Pulse: 66 71 60 64  Resp:  19 19 18   Temp:  97.7 F (36.5 C) (!) 97.5 F (36.4 C) 98.2 F (36.8 C)  TempSrc:  Oral Oral   SpO2:  (!) 87% 99% 95%  Weight:   59.7 kg   Height:        Intake/Output Summary (Last 24 hours) at 01/15/2021 1235 Last data filed at 01/15/2021 1012 Gross per 24 hour  Intake 720 ml  Output 810 ml  Net -90 ml   Filed Weights   01/13/21 0215 01/14/21 0500 01/15/21 0413  Weight: 61.6 kg 63 kg 59.7 kg    Telemetry    SR, 50s to 60s bpm, rare PVCs - Personally Reviewed  ECG    No new tracings - Personally Reviewed  Physical Exam   GEN: No acute distress, elderly appearing.   Neck: No JVD. Cardiac: RRR, no murmurs, rubs, or gallops.  Respiratory: Clear to auscultation bilaterally.  GI: Soft,  nontender, non-distended.   MS: No edema; No deformity. Neuro:  Alert and oriented x 3; Nonfocal.  Psych: Normal affect.  Labs    Chemistry Recent Labs  Lab 01/13/21 [phone] 01/14/21 0414 01/15/21 0648  NA 135 132* 134*  K 3.8 4.2 3.7  CL 98 93* 96*  CO2 29 30 29   GLUCOSE 84 102* 95  BUN 31* 38* 35*  CREATININE 0.90 1.16 0.88  CALCIUM 9.7 10.0 9.7  GFRNONAA >60 58* >60  ANIONGAP 8 9 9      Hematology Recent Labs  Lab 01/10/21 1136 01/11/21 0309 01/12/21 0645  WBC 6.3 6.1 6.3  RBC 3.95* 4.19* 4.30  HGB 12.9* 13.7 14.2  HCT 40.2 42.1 42.4  MCV 101.8* 100.5* 98.6  MCH 32.7 32.7 33.0  MCHC 32.1 32.5 33.5  RDW 14.5 14.6 14.6  PLT 229 211 211    Cardiac EnzymesNo results for input(s): TROPONINI in the last 168 hours. No results for input(s): TROPIPOC in the last 168 hours.   BNP Recent Labs  Lab 01/10/21 1135 01/13/21 0520  BNP 1,927.9* 1,450.6*     DDimer No results for input(s): DDIMER in the last 168 hours.   Radiology    DG Chest 2 View  Result Date: 01/10/2021 IMPRESSION: Lower lung volumes with increased diffuse subpleural reticulation, central airway thickening and architectural distortion. Findings may reflect progressive interstitial lung disease or superimposed atypical infection, including viral pneumonia. No focal airspace disease. Electronically Signed   By: Richardean Sale M.D.   On: 01/10/2021 12:53   CT Angio Chest PE W and/or Wo Contrast  Result Date: 01/10/2021 IMPRESSION: No evidence of pulmonary embolism. Cardiomegaly, diffuse interstitial infiltrates, bilateral pleural effusions, suspicious for congestive heart failure. Bilateral calcified pleural plaque, consistent with asbestos related pleural disease. 4.1 cm ascending thoracic aortic aneurysm. Recommend annual imaging followup by CTA or MRA. This recommendation follows 2010 ACCF/AHA/AATS/ACR/ASA/SCA/SCAI/SIR/STS/SVM Guidelines for the Diagnosis and Management of Patients with Thoracic  Aortic Disease. Circulation. 2010; 121: T245-Y099. Aortic aneurysm NOS (ICD10-I71.9) Aortic Atherosclerosis (ICD10-I70.0). Electronically Signed   By: Marlaine Hind M.D.   On: 01/10/2021 14:33   Cardiac Studies   2D echo 01/11/21: 1. Left ventricular ejection fraction, by estimation, is 25 to 30%. The  left ventricle has severely decreased function. The left ventricle  demonstrates global hypokinesis. There is mild left ventricular  hypertrophy. Left ventricular diastolic parameters  are consistent with Grade II diastolic dysfunction (pseudonormalization).  Elevated left atrial pressure. The average left ventricular global  longitudinal strain is -7.3 %. The global longitudinal strain is abnormal.  2. Right ventricular systolic function is low normal. The right  ventricular size is normal. Tricuspid regurgitation signal is inadequate  for assessing PA pressure.  3. Left atrial size was mildly dilated.  4. The mitral valve is degenerative. Mild to moderate mitral valve  regurgitation. No evidence of mitral stenosis.  5. The aortic valve is tricuspid. There is moderate calcification of the  aortic valve. There is moderate thickening of the aortic valve. Aortic  valve regurgitation is mild to moderate. Mild to moderate aortic valve  sclerosis/calcification is present,  without any evidence of aortic stenosis.  6. Mildly dilated pulmonary artery.  7. The inferior vena cava is dilated in size with <50% respiratory  variability, suggesting right atrial pressure of 15 mmHg.   Patient Profile     87 y.o. male with history of HLD, hypothyroidism, colon cancer, hearing loss, BPH, and diplopia who is being seen today for the evaluation of new onset HFrEF and elevated troponin.  Assessment & Plan    1.  Acute HFrEF: -Echo this admission demonstrated an EF of 25 to 30% with volume status improved -Ischemia evaluation has been deferred as outlined below given his advanced age -Transition  from IV Las 40 mg bid to oral Lasix 40 mg daily, starting with these evenings dose  -Continue GDMT including carvedilol and lisinopril -Consider addition of low-dose spironolactone as an outpatient in follow up  -Daily weights -Strict I's and O's  2.  Demand ischemia: -High-sensitivity troponin peaking at 240 in the setting of the above without chest pain -Conservative medical therapy has been pursued after discussion with patient, family, and PCP given his advanced age which has been felt to be reasonable with interventional cardiology -High suspicion for underlying CAD, therefore he will continue aspirin, atorvastatin, carvedilol, and lisinopril  3. Ascending thoracic aortic aneurysm: -Incidentally noted measuring 4.1 cm on CTA chest at this admission -Given his advanced age he would not likely be a candidate for surgical repair -Recommend optimal blood pressure and lipid control -Follow-up as outpatient  4. HLD: -LDL 78 this admission -Continue atorvastatin  For questions or updates, please contact [place] Please consult [email or web address] for contact info under  Cardiology/STEMI.    Signed, Christell Faith, PA-C [facility] Pager: [phone] 01/15/2021, 12:35 PM   I have seen, examined the patient, and reviewed the above assessment and plan.  Changes to above are made where necessary.  On exam, RRR.  Elderly and fragile. He is clinically improving.  Continue current plan.  Given advanced age, I would not advise invasive procedures.  Co Sign: Thompson Grayer, MD 01/15/2021 2:48 PM

## 2021-01-15 NOTE — Progress Notes (Signed)
PROGRESS NOTE    Adam Bentley  HER:[id number] DOB: 12-31-1923 DOA: 01/10/2021 PCP: Venia Carbon, MD    Brief Narrative:  This 87 years old male with PMH significant for hyperlipidemia, hypothyroidism, colon cancer, hearing loss, BPH, diplopia who presents with acute shortness of breath.  Patient reports having shortness of breath for more than 3 days associated with orthopnea and generalized weakness.  Patient was found to be  desatting at 78% on room air which improved to 98% on rebreather on arrival.  He is found to have BNP 1927.  Chest x-ray shows interstitial edema,  CTA chest ruled out PE but showed cardiomegaly and bilateral interstitial edema.  Patient is admitted for new onset CHF, Cardiology consulted,  recommended aggressive diuresis, Patient may need ischemic evaluation given decrease EF on recent echocardiogram.  1/20-feeling better today. Less sob. No cp 1/21-feeling better. No cp. Yesterday with PT 02 sat dropped to 80's/ plz see PT note. 1/22-with ambulation 02 sat drops to 80's 85-88%.no overnight issues.  Weight from 63 to 59.7 kg for the past 24 h    Consultants:   cardiology  Procedures:   Antimicrobials:       Subjective: Denies cp, sob, or any other issues  Objective: Vitals:   01/14/21 1829 01/14/21 1942 01/15/21 0413 01/15/21 0836  BP: 126/74 137/82 123/66 106/72  Pulse: 66 71 60 64  Resp:  19 19 18   Temp:  97.7 F (36.5 C) (!) 97.5 F (36.4 C) 98.2 F (36.8 C)  TempSrc:  Oral Oral   SpO2:  (!) 87% 99% 95%  Weight:   59.7 kg   Height:        Intake/Output Summary (Last 24 hours) at 01/15/2021 0852 Last data filed at 01/15/2021 0843 Gross per 24 hour  Intake 960 ml  Output 1660 ml  Net -700 ml   Filed Weights   01/13/21 0215 01/14/21 0500 01/15/21 0413  Weight: 61.6 kg 63 kg 59.7 kg    Examination: Calm, NAD CTA, no wheeze rales rhonchi's Regular S I-S II no gallops Soft benign positive bowel sounds No edema Alert  oriented x3, grossly intact Mood and affect appropriate in current setting    Data Reviewed: I have personally reviewed following labs and imaging studies  CBC: Recent Labs  Lab 01/10/21 1136 01/11/21 0309 01/12/21 0645  WBC 6.3 6.1 6.3  HGB 12.9* 13.7 14.2  HCT 40.2 42.1 42.4  MCV 101.8* 100.5* 98.6  PLT 229 211 856   Basic Metabolic Panel: Recent Labs  Lab 01/11/21 0309 01/12/21 0645 01/13/21 0620 01/14/21 0414 01/15/21 0648  NA 141 138 135 132* 134*  K 4.3 3.9 3.8 4.2 3.7  CL 105 101 98 93* 96*  CO2 27 26 29 30 29   GLUCOSE 103* 90 84 102* 95  BUN 25* 28* 31* 38* 35*  CREATININE 0.94 0.92 0.90 1.16 0.88  CALCIUM 10.0 9.7 9.7 10.0 9.7  MG 2.2 2.3  --   --   --   PHOS  --  2.9  --   --   --    GFR: Estimated Creatinine Clearance: 43.3 mL/min (by C-G formula based on SCr of 0.88 mg/dL). Liver Function Tests: No results for input(s): AST, ALT, ALKPHOS, BILITOT, PROT, ALBUMIN in the last 168 hours. No results for input(s): LIPASE, AMYLASE in the last 168 hours. No results for input(s): AMMONIA in the last 168 hours. Coagulation Profile: No results for input(s): INR, PROTIME in the last 168 hours. Cardiac Enzymes:  No results for input(s): CKTOTAL, CKMB, CKMBINDEX, TROPONINI in the last 168 hours. BNP (last 3 results) No results for input(s): PROBNP in the last 8760 hours. HbA1C: No results for input(s): HGBA1C in the last 72 hours. CBG: No results for input(s): GLUCAP in the last 168 hours. Lipid Profile: No results for input(s): CHOL, HDL, LDLCALC, TRIG, CHOLHDL, LDLDIRECT in the last 72 hours. Thyroid Function Tests: No results for input(s): TSH, T4TOTAL, FREET4, T3FREE, THYROIDAB in the last 72 hours. Anemia Panel: No results for input(s): VITAMINB12, FOLATE, FERRITIN, TIBC, IRON, RETICCTPCT in the last 72 hours. Sepsis Labs: No results for input(s): PROCALCITON, LATICACIDVEN in the last 168 hours.  Recent Results (from the past 240 hour(s))  Resp Panel  by RT-PCR (Flu A&B, Covid) Nasopharyngeal Swab     Status: None   Collection Time: 01/10/21 11:36 AM   Specimen: Nasopharyngeal Swab; Nasopharyngeal(NP) swabs in vial transport medium  Result Value Ref Range Status   SARS Coronavirus 2 by RT PCR NEGATIVE NEGATIVE Final    Comment: (NOTE) SARS-CoV-2 target nucleic acids are NOT DETECTED.  The SARS-CoV-2 RNA is generally detectable in upper respiratory specimens during the acute phase of infection. The lowest concentration of SARS-CoV-2 viral copies this assay can detect is 138 copies/mL. A negative result does not preclude SARS-Cov-2 infection and should not be used as the sole basis for treatment or other patient management decisions. A negative result may occur with  improper specimen collection/handling, submission of specimen other than nasopharyngeal swab, presence of viral mutation(s) within the areas targeted by this assay, and inadequate number of viral copies(<138 copies/mL). A negative result must be combined with clinical observations, patient history, and epidemiological information. The expected result is Negative.  Fact Sheet for Patients:  EntrepreneurPulse.com.au  Fact Sheet for Healthcare Providers:  IncredibleEmployment.be  This test is no t yet approved or cleared by the [place] FDA and  has been authorized for detection and/or diagnosis of SARS-CoV-2 by FDA under an Emergency Use Authorization (EUA). This EUA will remain  in effect (meaning this test can be used) for the duration of the COVID-19 declaration under Section 564(b)(1) of the Act, 21 U.S.C.section 360bbb-3(b)(1), unless the authorization is terminated  or revoked sooner.       Influenza A by PCR NEGATIVE NEGATIVE Final   Influenza B by PCR NEGATIVE NEGATIVE Final    Comment: (NOTE) The Xpert Xpress SARS-CoV-2/FLU/RSV plus assay is intended as an aid in the diagnosis of influenza from Nasopharyngeal swab  specimens and should not be used as a sole basis for treatment. Nasal washings and aspirates are unacceptable for Xpert Xpress SARS-CoV-2/FLU/RSV testing.  Fact Sheet for Patients: EntrepreneurPulse.com.au  Fact Sheet for Healthcare Providers: IncredibleEmployment.be  This test is not yet approved or cleared by the [place] FDA and has been authorized for detection and/or diagnosis of SARS-CoV-2 by FDA under an Emergency Use Authorization (EUA). This EUA will remain in effect (meaning this test can be used) for the duration of the COVID-19 declaration under Section 564(b)(1) of the Act, 21 U.S.C. section 360bbb-3(b)(1), unless the authorization is terminated or revoked.  Performed at [facility], [address], [place],  [address]          Radiology Studies: No results found.      Scheduled Meds: . aspirin EC  81 mg Oral Daily  . atorvastatin  40 mg Oral Daily  . carvedilol  3.125 mg Oral BID WC  . enoxaparin (LOVENOX) injection  40 mg Subcutaneous  Q24H  . furosemide  40 mg Intravenous BID  . levothyroxine  100 mcg Oral Daily  . lisinopril  2.5 mg Oral Daily  . sodium chloride flush  3 mL Intravenous Q12H  . cyanocobalamin  1,000 mcg Oral Daily   Continuous Infusions: . sodium chloride      Assessment & Plan:   Principal Problem:   Acute CHF (congestive heart failure) (HCC) Active Problems:   Hypothyroidism   HLD (hyperlipidemia)   Acute respiratory failure with hypoxia (HCC)   Ascending aortic aneurysm (HCC)   Elevated troponin   Acute hypoxic respiratory failuredue to acute systolic HF (congestive heart failure): Patient presented with acute shortness of breath,  found to have O2 saturation of 78% on room air. Weaned down on 02. CT nega for PE Echo with EF 25 to 30% with global hypokinesis 1/22-euvolemic on exam.  Has dropped weight.  Clinically has improved Transition IV Lasix 40 mg  twice daily to oral Lasix 40 mg daily Continue carvedilol and lisinopril Consider addition of low-dose spironolactone as an outpatient and follow-up Daily weight and I's and O's Palliative care consulted pending     Elevated troponin: Could be secondary to demand ischemia. Cardiology does not think patient is having ACS 1/22-asymptomatic without chest pain No plans for cardiac catheterization after discussion with family and patient due to his advanced age which felt to be reasonable with interventional cardiology. Continue medical management with beta-blockers, statin, aspirin as there is high suspicion for underlying CAD     Cardiomyopathy- ischemic v.s. nonischemic EF 25 to 30% on echo  1/22 -no plans for left heart cath as mentioned above  Will transition IV Lasix to p.o. as he is clinically euvolemic and improved symptoms  Continue ACE and carvedilol  Addition of Aldactone as outpatient and follow-up      Hypothyroidism Continue Synthroid  HLD (hyperlipidemia): continue statin  Ascending aortic aneurysm ([place]):Incidental finding by CT angiogram, 1 cm ascending aortic aneurysm -Follow-up with PCP     DVT prophylaxis: Lovenox Code Status: DNR Family Communication: spoke to nephew Delfino Lovett at [phone]  Status is: Inpatient  Remains inpatient appropriate because: Unsafe discharge  Dispo: The patient is from: independent living              Anticipated d/c is to: TBD, wants to speak to assisted living               Anticipated d/c date is: 1-2 days              Patient currently is medically stable. Needs 02 delivered, also needs evaluation for assisted living requested by family due to safety of pt.           LOS: 5 days   Time spent:35 min with >50% on coc    Nolberto Hanlon, MD [facility] Pager [phone]  If 7PM-7AM, please contact night-coverage 01/15/2021, 8:52 AM

## 2021-01-15 NOTE — TOC Progression Note (Signed)
Transition of Care (TOC) - Progression Note    Patient Details  Name: Adam Bentley. Trompeter MRN: [id number] Date of Birth: 19-Jun-1924  Transition of Care [facility]) CM/SW Contact  Izola Price, RN Phone Number: 01/15/2021, 5:53 PM  Clinical Narrative: 01/15/21  Will speak to family members on 1/23 per provider request. Simmie Davies RN CM. [phone].            Expected Discharge Plan and Services                                                 Social Determinants of Health (SDOH) Interventions    Readmission Risk Interventions No flowsheet data found.

## 2021-01-15 NOTE — Telephone Encounter (Signed)
Message again today Will have him reach out again if he still has questions

## 2021-01-16 DIAGNOSIS — R778 Other specified abnormalities of plasma proteins: Secondary | ICD-10-CM | POA: Diagnosis not present

## 2021-01-16 DIAGNOSIS — J9601 Acute respiratory failure with hypoxia: Secondary | ICD-10-CM | POA: Diagnosis not present

## 2021-01-16 DIAGNOSIS — I5021 Acute systolic (congestive) heart failure: Secondary | ICD-10-CM | POA: Diagnosis not present

## 2021-01-16 DIAGNOSIS — I712 Thoracic aortic aneurysm, without rupture: Secondary | ICD-10-CM | POA: Diagnosis not present

## 2021-01-16 NOTE — TOC Progression Note (Signed)
Transition of Care (TOC) - Progression Note    Patient Details  Name: Adam Bentley MRN: [id number] Date of Birth: 12/17/1924  Transition [facility]) CM/SW Contact  Izola Price, RN Phone Number: 01/16/2021, 5:45 PM  Clinical Narrative:    1/23 Attempted to reach out to niece to discuss situation but had to leave VM and message that weekday CM will reach out on Monday. She is POA and they have concerns about returning to facility patient came from. Simmie Davies RN CM         Expected Discharge Plan and Services                                                 Social Determinants of Health (SDOH) Interventions    Readmission Risk Interventions No flowsheet data found.

## 2021-01-16 NOTE — Progress Notes (Signed)
Physical Therapy Treatment Patient Details Name: Adam Bentley. Fees MRN: [id number] DOB: 11-Apr-1924 Today's Date: 01/16/2021    History of Present Illness 87 y.o. male with medical history significant of hyperlipidemia, hypothyroidism, colon cancer, hearing loss, BPH, diplopia, who presents with shortness of breath for more than 3 days, which has been progressively worsening. Negative for PE, covid.    PT Comments    Pt is highly motivated to work with PT. He is able to ambulate 200'x2 with 2 standing rest breaks while maintaining oxygen saturation >94%. The pt demonstrates decreased safety awareness during gait with balance deficits noted. Pt is appropriate for current discharge plan.    Follow Up Recommendations  Home health PT     Equipment Recommendations  None recommended by PT    Recommendations for Other Services       Precautions / Restrictions Precautions Precautions: Fall Precaution Comments: Pt with poor safety awareness Restrictions Weight Bearing Restrictions: No    Mobility  Bed Mobility Overal bed mobility: Independent             General bed mobility comments: Pt not requiring increased time for transition to EOB.  Transfers Overall transfer level: Needs assistance Equipment used: Rolling walker (2 wheeled) Transfers: Sit to/from Stand Sit to Stand: Supervision         General transfer comment: Pt requires verbal cues for hand placement to safely stand from bed height to RW.  Ambulation/Gait Ambulation/Gait assistance: Supervision Gait Distance (Feet): 400 Feet Assistive device: Rolling walker (2 wheeled)       General Gait Details: Pt presents with no hesitation with initiating gait. He intermittently demonstrates poor safety awareness with forward flexed posture and requires cues for maintaining body within the RW. O2 readings best in the R index finger. He mainted SpO2>94% throughout ambulation. During static standing for O2 reading the pt  demonstrates posterior lean requiring Min A for correction. x1 LOB when ambulating and letting go of RW with unilateral UE, Min A for correction.   Stairs             Wheelchair Mobility    Modified Rankin (Stroke Patients Only)       Balance Overall balance assessment: Needs assistance Sitting-balance support: No upper extremity supported Sitting balance-Leahy Scale: Normal     Standing balance support: Single extremity supported Standing balance-Leahy Scale: Fair Standing balance comment: Pt demonstrating posterior lean with static standing with unilateral UE support.                            Cognition Arousal/Alertness: Awake/alert Behavior During Therapy: WFL for tasks assessed/performed Overall Cognitive Status: Within Functional Limits for tasks assessed                                        Exercises      General Comments        Pertinent Vitals/Pain Pain Assessment: No/denies pain    Home Living                      Prior Function            PT Goals (current goals can now be found in the care plan section) Acute Rehab PT Goals Patient Stated Goal: go home PT Goal Formulation: With patient Time For Goal Achievement: 01/25/21 Potential to Achieve Goals: Good  Progress towards PT goals: Progressing toward goals    Frequency    Min 2X/week      PT Plan Current plan remains appropriate    Co-evaluation              AM-PAC PT "6 Clicks" Mobility   Outcome Measure  Help needed turning from your back to your side while in a flat bed without using bedrails?: None Help needed moving from lying on your back to sitting on the side of a flat bed without using bedrails?: None Help needed moving to and from a bed to a chair (including a wheelchair)?: None Help needed standing up from a chair using your arms (e.g., wheelchair or bedside chair)?: A Little Help needed to walk in hospital room?: A  Little Help needed climbing 3-5 steps with a railing? : A Little 6 Click Score: 21    End of Session Equipment Utilized During Treatment: Gait belt Activity Tolerance: Patient tolerated treatment well Patient left: with call bell/phone within reach;with bed alarm set;in chair;with chair alarm set Nurse Communication: Mobility status;Other (comment) PT Visit Diagnosis: Muscle weakness (generalized) (M62.81);Difficulty in walking, not elsewhere classified (R26.2)     Time: 3875-6433 PT Time Calculation (min) (ACUTE ONLY): 25 min  Charges:  $Gait Training: 23-37 mins                     1:37 PM, 01/16/21 Maryse Brierley A. Saverio Danker PT, DPT Physical Therapist - [facility]    Tawn Fitzner A Jaymes Revels 01/16/2021, 1:14 PM

## 2021-01-16 NOTE — Progress Notes (Signed)
Mobility Specialist - Progress Note   01/16/21 1308  Mobility  Activity Contraindicated/medical hold (Pt working with PT)  Mobility performed by Mobility specialist    Pt was ambulating halls with PT upon arrival to room. Will attempt session at another date/time as available.    Kathee Delton Mobility Specialist 01/16/21, 1:09 PM

## 2021-01-16 NOTE — Progress Notes (Addendum)
Progress Note  Patient Name: Adam Bentley Date of Encounter: 01/16/2021  Primary Cardiologist: End  Subjective   He feels well today. No chest pain or dyspnea. Reports he is able to sleep fully supine. Documented UOP 805 mL for the past 24 hours with a net - 1.6 mL for the admission. Weight 59.7-->60.6 kg for the past 24 hours. Renal function stable. Hopeful to be discharged today.   Inpatient Medications    Scheduled Meds: . aspirin EC  81 mg Oral Daily  . atorvastatin  40 mg Oral Daily  . carvedilol  3.125 mg Oral BID WC  . enoxaparin (LOVENOX) injection  40 mg Subcutaneous Q24H  . furosemide  40 mg Oral Daily  . levothyroxine  100 mcg Oral Daily  . lisinopril  2.5 mg Oral Daily  . potassium chloride  20 mEq Oral Daily  . sodium chloride flush  3 mL Intravenous Q12H  . cyanocobalamin  1,000 mcg Oral Daily   Continuous Infusions: . sodium chloride     PRN Meds: sodium chloride, acetaminophen, albuterol, dextromethorphan-guaiFENesin, ondansetron (ZOFRAN) IV, sodium chloride flush   Vital Signs    Vitals:   01/15/21 1817 01/15/21 2300 01/16/21 0500 01/16/21 0750  BP: 109/66 112/68 124/72 122/68  Pulse: 60 60 (!) 53 60  Resp: 19 18 16 16   Temp: 97.6 F (36.4 C) 98 F (36.7 C) 98.1 F (36.7 C) 98 F (36.7 C)  TempSrc: Oral Oral Oral Oral  SpO2: 97% 98% 97% 98%  Weight:   60.6 kg   Height:        Intake/Output Summary (Last 24 hours) at 01/16/2021 0754 Last data filed at 01/16/2021 0053 Gross per 24 hour  Intake 480 ml  Output 1285 ml  Net -805 ml   Filed Weights   01/14/21 0500 01/15/21 0413 01/16/21 0500  Weight: 63 kg 59.7 kg 60.6 kg    Telemetry    SR, 50s to 60s bpm - Personally Reviewed  ECG    No new tracings - Personally Reviewed  Physical Exam   GEN: No acute distress, elderly appearing.   Neck: No JVD. Cardiac: RRR, no murmurs, rubs, or gallops.  Respiratory: Clear to auscultation bilaterally.  GI: Soft, nontender,  non-distended.   MS: No edema; No deformity. Neuro:  Alert and oriented x 3; Nonfocal.  Psych: Normal affect.  Labs    Chemistry Recent Labs  Lab 01/13/21 [phone] 01/14/21 0414 01/15/21 0648  NA 135 132* 134*  K 3.8 4.2 3.7  CL 98 93* 96*  CO2 29 30 29   GLUCOSE 84 102* 95  BUN 31* 38* 35*  CREATININE 0.90 1.16 0.88  CALCIUM 9.7 10.0 9.7  GFRNONAA >60 58* >60  ANIONGAP 8 9 9      Hematology Recent Labs  Lab 01/10/21 1136 01/11/21 0309 01/12/21 0645  WBC 6.3 6.1 6.3  RBC 3.95* 4.19* 4.30  HGB 12.9* 13.7 14.2  HCT 40.2 42.1 42.4  MCV 101.8* 100.5* 98.6  MCH 32.7 32.7 33.0  MCHC 32.1 32.5 33.5  RDW 14.5 14.6 14.6  PLT 229 211 211    Cardiac EnzymesNo results for input(s): TROPONINI in the last 168 hours. No results for input(s): TROPIPOC in the last 168 hours.   BNP Recent Labs  Lab 01/10/21 1135 01/13/21 0520  BNP 1,927.9* 1,450.6*     DDimer No results for input(s): DDIMER in the last 168 hours.   Radiology    DG Chest 2 View  Result Date: 01/10/2021 IMPRESSION:  Lower lung volumes with increased diffuse subpleural reticulation, central airway thickening and architectural distortion. Findings may reflect progressive interstitial lung disease or superimposed atypical infection, including viral pneumonia. No focal airspace disease. Electronically Signed   By: Richardean Sale M.D.   On: 01/10/2021 12:53   CT Angio Chest PE W and/or Wo Contrast  Result Date: 01/10/2021 IMPRESSION: No evidence of pulmonary embolism. Cardiomegaly, diffuse interstitial infiltrates, bilateral pleural effusions, suspicious for congestive heart failure. Bilateral calcified pleural plaque, consistent with asbestos related pleural disease. 4.1 cm ascending thoracic aortic aneurysm. Recommend annual imaging followup by CTA or MRA. This recommendation follows 2010 ACCF/AHA/AATS/ACR/ASA/SCA/SCAI/SIR/STS/SVM Guidelines for the Diagnosis and Management of Patients with Thoracic Aortic  Disease. Circulation. 2010; 121: Z610-R604. Aortic aneurysm NOS (ICD10-I71.9) Aortic Atherosclerosis (ICD10-I70.0). Electronically Signed   By: Marlaine Hind M.D.   On: 01/10/2021 14:33   Cardiac Studies   2D echo 01/11/21: 1. Left ventricular ejection fraction, by estimation, is 25 to 30%. The  left ventricle has severely decreased function. The left ventricle  demonstrates global hypokinesis. There is mild left ventricular  hypertrophy. Left ventricular diastolic parameters  are consistent with Grade II diastolic dysfunction (pseudonormalization).  Elevated left atrial pressure. The average left ventricular global  longitudinal strain is -7.3 %. The global longitudinal strain is abnormal.  2. Right ventricular systolic function is low normal. The right  ventricular size is normal. Tricuspid regurgitation signal is inadequate  for assessing PA pressure.  3. Left atrial size was mildly dilated.  4. The mitral valve is degenerative. Mild to moderate mitral valve  regurgitation. No evidence of mitral stenosis.  5. The aortic valve is tricuspid. There is moderate calcification of the  aortic valve. There is moderate thickening of the aortic valve. Aortic  valve regurgitation is mild to moderate. Mild to moderate aortic valve  sclerosis/calcification is present,  without any evidence of aortic stenosis.  6. Mildly dilated pulmonary artery.  7. The inferior vena cava is dilated in size with <50% respiratory  variability, suggesting right atrial pressure of 15 mmHg.   Patient Profile     87 y.o. male with history of HLD, hypothyroidism, colon cancer, hearing loss, BPH, and diplopia who is being seen today for the evaluation of new onset HFrEF and elevated troponin.  Assessment & Plan    1.  Acute HFrEF: -Echo this admission demonstrated an EF of 25 to 30% with volume status improved -Ischemia evaluation has been deferred as outlined below given his advanced age -He has been  transitioned to oral Lasix -Continue GDMT including carvedilol and lisinopril -Consider addition of low-dose spironolactone as an outpatient in follow up  -Daily weights -Strict I's and O's  2.  Demand ischemia: -High-sensitivity troponin peaking at 240 in the setting of the above without chest pain -Conservative medical therapy has been pursued after discussion with patient, family, and PCP given his advanced age which has been felt to be reasonable with interventional cardiology -High suspicion for underlying CAD, therefore he will continue aspirin, atorvastatin, carvedilol, and lisinopril  3. Ascending thoracic aortic aneurysm: -Incidentally noted measuring 4.1 cm on CTA chest at this admission -Given his advanced age he would not likely be a candidate for surgical repair -Recommend optimal blood pressure and lipid control -Follow-up as outpatient  4. HLD: -LDL 78 this admission -Continue atorvastatin  -Stable for discharge from our perspective on current cardiac medications, once staffed by our MD. We will arrange follow up in our office.    For questions or updates, please  contact [place] Please consult [email or web address] for contact info under Cardiology/STEMI.    Signed, Christell Faith, PA-C [facility] Pager: [phone] 01/16/2021, 7:54 AM   I have seen, examined the patient, and reviewed the above assessment and plan.  Changes to above are made where necessary.  On exam, elderly, NAD, RRR.  Clinically much improved.  Now on oral lasix. Anticipate discharge potentially today.  We will arrange outpatient cardiology follow-up.   Co Sign: Thompson Grayer, MD 01/16/2021 3:29 PM

## 2021-01-16 NOTE — Progress Notes (Signed)
PROGRESS NOTE    Adam Bentley  NU:[id number] DOB: [birth year] DOA: 01/10/2021 PCP: Venia Carbon, MD    Brief Narrative:  This 87 years old male with PMH significant for hyperlipidemia, hypothyroidism, colon cancer, hearing loss, BPH, diplopia who presents with acute shortness of breath.  Patient reports having shortness of breath for more than 3 days associated with orthopnea and generalized weakness.  Patient was found to be  desatting at 78% on room air which improved to 98% on rebreather on arrival.  He is found to have BNP 1927.  Chest x-ray shows interstitial edema,  CTA chest ruled out PE but showed cardiomegaly and bilateral interstitial edema.  Patient is admitted for new onset CHF, Cardiology consulted,  recommended aggressive diuresis, Patient may need ischemic evaluation given decrease EF on recent echocardiogram.  1/20-feeling better today. Less sob. No cp 1/21-feeling better. No cp. Yesterday with PT 02 sat dropped to 80's/ plz see PT note. 1/22-with ambulation 02 sat drops to 80's 85-88%.no overnight issues.  Weight from 63 to 59.7 kg for the past 24 h 1/23-no overnight issues.  Encourage patient to use incentive spirometer.  UOP 805 mL for the past 24 hours with a net -851ml   Consultants:   cardiology  Procedures:   Antimicrobials:       Subjective: Has no complaints today.  Denies chest pain, dizziness, shortness of breath or palpitations  Objective: Vitals:   01/16/21 1016 01/16/21 1253 01/16/21 1302 01/16/21 1546  BP:  108/68  115/69  Pulse: 64 64  62  Resp:  16  16  Temp:  98.4 F (36.9 C)  97.6 F (36.4 C)  TempSrc:  Oral  Oral  SpO2:   98% 100%  Weight:      Height:        Intake/Output Summary (Last 24 hours) at 01/16/2021 1708 Last data filed at 01/16/2021 1403 Gross per 24 hour  Intake 243 ml  Output 1125 ml  Net -882 ml   Filed Weights   01/14/21 0500 01/15/21 0413 01/16/21 0500  Weight: 63 kg 59.7 kg 60.6 kg     Examination: Lying in bed, NAD CTA no wheeze rales rhonchi's Regular S1-S2 no gallops Soft benign positive bowel sounds No edema Alert oriented x3, grossly intact Mood and affect appropriate in current    Data Reviewed: I have personally reviewed following labs and imaging studies  CBC: Recent Labs  Lab 01/10/21 1136 01/11/21 0309 01/12/21 0645  WBC 6.3 6.1 6.3  HGB 12.9* 13.7 14.2  HCT 40.2 42.1 42.4  MCV 101.8* 100.5* 98.6  PLT 229 211 [id number]   Basic Metabolic Panel: Recent Labs  Lab 01/11/21 0309 01/12/21 0645 01/13/21 0620 01/14/21 0414 01/15/21 0648  NA 141 138 135 132* 134*  K 4.3 3.9 3.8 4.2 3.7  CL 105 101 98 93* 96*  CO2 27 26 29 30 29   GLUCOSE 103* 90 84 102* 95  BUN 25* 28* 31* 38* 35*  CREATININE 0.94 0.92 0.90 1.16 0.88  CALCIUM 10.0 9.7 9.7 10.0 9.7  MG 2.2 2.3  --   --   --   PHOS  --  2.9  --   --   --    GFR: Estimated Creatinine Clearance: 44 mL/min (by C-G formula based on SCr of 0.88 mg/dL). Liver Function Tests: No results for input(s): AST, ALT, ALKPHOS, BILITOT, PROT, ALBUMIN in the last 168 hours. No results for input(s): LIPASE, AMYLASE in the last 168 hours. No results for  input(s): AMMONIA in the last 168 hours. Coagulation Profile: No results for input(s): INR, PROTIME in the last 168 hours. Cardiac Enzymes: No results for input(s): CKTOTAL, CKMB, CKMBINDEX, TROPONINI in the last 168 hours. BNP (last 3 results) No results for input(s): PROBNP in the last 8760 hours. HbA1C: No results for input(s): HGBA1C in the last 72 hours. CBG: No results for input(s): GLUCAP in the last 168 hours. Lipid Profile: No results for input(s): CHOL, HDL, LDLCALC, TRIG, CHOLHDL, LDLDIRECT in the last 72 hours. Thyroid Function Tests: No results for input(s): TSH, T4TOTAL, FREET4, T3FREE, THYROIDAB in the last 72 hours. Anemia Panel: No results for input(s): VITAMINB12, FOLATE, FERRITIN, TIBC, IRON, RETICCTPCT in the last 72 hours. Sepsis  Labs: No results for input(s): PROCALCITON, LATICACIDVEN in the last 168 hours.  Recent Results (from the past 240 hour(s))  Resp Panel by RT-PCR (Flu A&B, Covid) Nasopharyngeal Swab     Status: None   Collection Time: 01/10/21 11:36 AM   Specimen: Nasopharyngeal Swab; Nasopharyngeal(NP) swabs in vial transport medium  Result Value Ref Range Status   SARS Coronavirus 2 by RT PCR NEGATIVE NEGATIVE Final    Comment: (NOTE) SARS-CoV-2 target nucleic acids are NOT DETECTED.  The SARS-CoV-2 RNA is generally detectable in upper respiratory specimens during the acute phase of infection. The lowest concentration of SARS-CoV-2 viral copies this assay can detect is 138 copies/mL. A negative result does not preclude SARS-Cov-2 infection and should not be used as the sole basis for treatment or other patient management decisions. A negative result may occur with  improper specimen collection/handling, submission of specimen other than nasopharyngeal swab, presence of viral mutation(s) within the areas targeted by this assay, and inadequate number of viral copies(<138 copies/mL). A negative result must be combined with clinical observations, patient history, and epidemiological information. The expected result is Negative.  Fact Sheet for Patients:  EntrepreneurPulse.com.au  Fact Sheet for Healthcare Providers:  IncredibleEmployment.be  This test is no t yet approved or cleared by the [place] FDA and  has been authorized for detection and/or diagnosis of SARS-CoV-2 by FDA under an Emergency Use Authorization (EUA). This EUA will remain  in effect (meaning this test can be used) for the duration of the COVID-19 declaration under Section 564(b)(1) of the Act, 21 U.S.C.section 360bbb-3(b)(1), unless the authorization is terminated  or revoked sooner.       Influenza A by PCR NEGATIVE NEGATIVE Final   Influenza B by PCR NEGATIVE NEGATIVE Final     Comment: (NOTE) The Xpert Xpress SARS-CoV-2/FLU/RSV plus assay is intended as an aid in the diagnosis of influenza from Nasopharyngeal swab specimens and should not be used as a sole basis for treatment. Nasal washings and aspirates are unacceptable for Xpert Xpress SARS-CoV-2/FLU/RSV testing.  Fact Sheet for Patients: EntrepreneurPulse.com.au  Fact Sheet for Healthcare Providers: IncredibleEmployment.be  This test is not yet approved or cleared by the [place] FDA and has been authorized for detection and/or diagnosis of SARS-CoV-2 by FDA under an Emergency Use Authorization (EUA). This EUA will remain in effect (meaning this test can be used) for the duration of the COVID-19 declaration under Section 564(b)(1) of the Act, 21 U.S.C. section 360bbb-3(b)(1), unless the authorization is terminated or revoked.  Performed at [facility], [address], [place], [place] [address]          Radiology Studies: No results found.      Scheduled Meds: . aspirin EC  81 mg Oral Daily  . atorvastatin  40 mg Oral Daily  . carvedilol  3.125 mg Oral BID WC  . enoxaparin (LOVENOX) injection  40 mg Subcutaneous Q24H  . furosemide  40 mg Oral Daily  . levothyroxine  100 mcg Oral Daily  . lisinopril  2.5 mg Oral Daily  . potassium chloride  20 mEq Oral Daily  . sodium chloride flush  3 mL Intravenous Q12H  . cyanocobalamin  1,000 mcg Oral Daily   Continuous Infusions: . sodium chloride      Assessment & Plan:   Principal Problem:   Acute CHF (congestive heart failure) (HCC) Active Problems:   Hypothyroidism   HLD (hyperlipidemia)   Acute respiratory failure with hypoxia (HCC)   Ascending aortic aneurysm (HCC)   Elevated troponin   Acute hypoxic respiratory failuredue to acute systolic HF (congestive heart failure): Patient presented with acute shortness of breath,  found to have O2 saturation of 78% on room  air. Weaned down on 02. CT nega for PE Echo with EF 25 to 30% with global hypokinesis 1/23-good urine output.  Euvolemic on exam.  O2 sat at rest is good.  We will recheck ambulatory O2 prior to discharge to see if he still requires oxygenation Continue p.o. Lasix Initially received IV Lasix Continue carvedilol and lisinopril Consider addition of low-dose spironolactone as outpatient and follow-up Daily weight and I's and O's Palliative care was consulted and pending     Elevated troponin: Could be secondary to demand ischemia. Cardiology does not think patient is having ACS 1/23-asymptomatic without chest pain No plans for cardiac catheterization after discussion with family and patient due to his advanced age which felt to be reasonable with interventional cardiology Continue medical management with beta-blockers, statin, aspirin as there is high suspicion for underlying CAD      Cardiomyopathy- ischemic v.s. nonischemic EF 25 to 30% on echo  1/23 -no plans for left heart cath as mentioned above Euvolemic on exam Continue Lasix, carvedilol, ACE inhibitors Addition of Aldactone as outpatient and follow-up     Hypothyroidism Continue Synthroid  HLD (hyperlipidemia): continue statin  Ascending aortic aneurysm ([place]):Incidental finding by CT angiogram, 1 cm ascending aortic aneurysm -Follow-up with PCP     DVT prophylaxis: Lovenox Code Status: DNR Family Communication: None at  Status is: Inpatient  Remains inpatient appropriate because: Unsafe discharge  Dispo: The patient is from: independent living              Anticipated d/c is to: TBD, wants to speak to assisted living               Anticipated d/c date is: 1-2 days              Patient currently is medically stable. Needs 02 delivered, also needs evaluation for assisted living requested by family due to safety of pt. case management is trying to get hold of him to have a discussion           LOS: 6 days   Time spent:35 min with >50% on coc    Nolberto Hanlon, MD [facility] Pager [phone]  If 7PM-7AM, please contact night-coverage 01/16/2021, 5:08 PM

## 2021-01-17 ENCOUNTER — Inpatient Hospital Stay: Payer: Medicare Other

## 2021-01-17 DIAGNOSIS — R778 Other specified abnormalities of plasma proteins: Secondary | ICD-10-CM | POA: Diagnosis not present

## 2021-01-17 DIAGNOSIS — I712 Thoracic aortic aneurysm, without rupture: Secondary | ICD-10-CM | POA: Diagnosis not present

## 2021-01-17 DIAGNOSIS — R531 Weakness: Secondary | ICD-10-CM | POA: Diagnosis not present

## 2021-01-17 DIAGNOSIS — I5021 Acute systolic (congestive) heart failure: Secondary | ICD-10-CM | POA: Diagnosis not present

## 2021-01-17 DIAGNOSIS — Z515 Encounter for palliative care: Secondary | ICD-10-CM | POA: Diagnosis not present

## 2021-01-17 DIAGNOSIS — Z66 Do not resuscitate: Secondary | ICD-10-CM

## 2021-01-17 DIAGNOSIS — J9601 Acute respiratory failure with hypoxia: Secondary | ICD-10-CM | POA: Diagnosis not present

## 2021-01-17 DIAGNOSIS — Z7189 Other specified counseling: Secondary | ICD-10-CM

## 2021-01-17 LAB — POTASSIUM: Potassium: 4.7 mmol/L (ref 3.5–5.1)

## 2021-01-17 LAB — SARS CORONAVIRUS 2 BY RT PCR (HOSPITAL ORDER, PERFORMED IN ~~LOC~~ HOSPITAL LAB): SARS Coronavirus 2: NEGATIVE

## 2021-01-17 NOTE — Progress Notes (Signed)
Palliative Medicine RN Note: Our office has rec'd multiple calls today from Sula Rumple [phone]) regarding appealing PT recs and whether [name] will have ambulance transportation to his ALF tomorrow. I passed the messages on to [facility], and she will call him back.  Marjie Skiff Orena Cavazos, RN, BSN, [facility] Palliative Medicine Team 01/17/2021 2:34 PM Office [phone]

## 2021-01-17 NOTE — Progress Notes (Signed)
PROGRESS NOTE    Adam Bentley  BZ:[id number] DOB: 1924/05/27 DOA: 01/10/2021 PCP: Venia Carbon, MD    Brief Narrative:  This 87 years old male with PMH significant for hyperlipidemia, hypothyroidism, colon cancer, hearing loss, BPH, diplopia who presents with acute shortness of breath.  Patient reports having shortness of breath for more than 3 days associated with orthopnea and generalized weakness.  Patient was found to be  desatting at 78% on room air which improved to 98% on rebreather on arrival.  He is found to have BNP 1927.  Chest x-ray shows interstitial edema,  CTA chest ruled out PE but showed cardiomegaly and bilateral interstitial edema.  Patient is admitted for new onset CHF, Cardiology consulted,  recommended aggressive diuresis, Patient may need ischemic evaluation given decrease EF on recent echocardiogram.  1/20-feeling better today. Less sob. No cp 1/21-feeling better. No cp. Yesterday with PT 02 sat dropped to 80's/ plz see PT note. 1/22-with ambulation 02 sat drops to 80's 85-88%.no overnight issues.  Weight from 63 to 59.7 kg for the past 24 h 1/23-no overnight issues.  Encourage patient to use incentive spirometer.  UOP 805 mL for the past 24 hours with a net -874ml  1/24-with ambulation yesterday his O2 sat was 94%.  -680 ml  Consultants:   cardiology  Procedures:   Antimicrobials:       Subjective: No chest pain, no shortness of breath.  Feels well.  Ate breakfast.  Objective: Vitals:   01/16/21 2016 01/17/21 0539 01/17/21 0923 01/17/21 1209  BP: 130/63 126/71 126/75 115/60  Pulse: 67 62 63 61  Resp: 17 18  18   Temp: 98 F (36.7 C) 98 F (36.7 C) 97.6 F (36.4 C) 98.7 F (37.1 C)  TempSrc:  Oral    SpO2: 100% 97% 100% 100%  Weight:  60.4 kg    Height:        Intake/Output Summary (Last 24 hours) at 01/17/2021 1340 Last data filed at 01/17/2021 1045 Gross per 24 hour  Intake 720 ml  Output 1400 ml  Net -680 ml   Filed Weights    01/15/21 0413 01/16/21 0500 01/17/21 0539  Weight: 59.7 kg 60.6 kg 60.4 kg    Examination: Sitting up in bed, in good mood.  NAD CTA, no wheeze rales rhonchi's Regular S1-S2 no murmurs or rubs Soft benign positive bowel sounds No edema Alert oriented x3   Data Reviewed: I have personally reviewed following labs and imaging studies  CBC: Recent Labs  Lab 01/11/21 0309 01/12/21 0645  WBC 6.1 6.3  HGB 13.7 14.2  HCT 42.1 42.4  MCV 100.5* 98.6  PLT 211 123456   Basic Metabolic Panel: Recent Labs  Lab 01/11/21 0309 01/12/21 0645 01/13/21 0620 01/14/21 0414 01/15/21 0648 01/17/21 0618  NA 141 138 135 132* 134*  --   K 4.3 3.9 3.8 4.2 3.7 4.7  CL 105 101 98 93* 96*  --   CO2 27 26 29 30 29   --   GLUCOSE 103* 90 84 102* 95  --   BUN 25* 28* 31* 38* 35*  --   CREATININE 0.94 0.92 0.90 1.16 0.88  --   CALCIUM 10.0 9.7 9.7 10.0 9.7  --   MG 2.2 2.3  --   --   --   --   PHOS  --  2.9  --   --   --   --    GFR: Estimated Creatinine Clearance: 43.9 mL/min (by C-G formula  based on SCr of 0.88 mg/dL). Liver Function Tests: No results for input(s): AST, ALT, ALKPHOS, BILITOT, PROT, ALBUMIN in the last 168 hours. No results for input(s): LIPASE, AMYLASE in the last 168 hours. No results for input(s): AMMONIA in the last 168 hours. Coagulation Profile: No results for input(s): INR, PROTIME in the last 168 hours. Cardiac Enzymes: No results for input(s): CKTOTAL, CKMB, CKMBINDEX, TROPONINI in the last 168 hours. BNP (last 3 results) No results for input(s): PROBNP in the last 8760 hours. HbA1C: No results for input(s): HGBA1C in the last 72 hours. CBG: No results for input(s): GLUCAP in the last 168 hours. Lipid Profile: No results for input(s): CHOL, HDL, LDLCALC, TRIG, CHOLHDL, LDLDIRECT in the last 72 hours. Thyroid Function Tests: No results for input(s): TSH, T4TOTAL, FREET4, T3FREE, THYROIDAB in the last 72 hours. Anemia Panel: No results for input(s): VITAMINB12,  FOLATE, FERRITIN, TIBC, IRON, RETICCTPCT in the last 72 hours. Sepsis Labs: No results for input(s): PROCALCITON, LATICACIDVEN in the last 168 hours.  Recent Results (from the past 240 hour(s))  Resp Panel by RT-PCR (Flu A&B, Covid) Nasopharyngeal Swab     Status: None   Collection Time: 01/10/21 11:36 AM   Specimen: Nasopharyngeal Swab; Nasopharyngeal(NP) swabs in vial transport medium  Result Value Ref Range Status   SARS Coronavirus 2 by RT PCR NEGATIVE NEGATIVE Final    Comment: (NOTE) SARS-CoV-2 target nucleic acids are NOT DETECTED.  The SARS-CoV-2 RNA is generally detectable in upper respiratory specimens during the acute phase of infection. The lowest concentration of SARS-CoV-2 viral copies this assay can detect is 138 copies/mL. A negative result does not preclude SARS-Cov-2 infection and should not be used as the sole basis for treatment or other patient management decisions. A negative result may occur with  improper specimen collection/handling, submission of specimen other than nasopharyngeal swab, presence of viral mutation(s) within the areas targeted by this assay, and inadequate number of viral copies(<138 copies/mL). A negative result must be combined with clinical observations, patient history, and epidemiological information. The expected result is Negative.  Fact Sheet for Patients:  EntrepreneurPulse.com.au  Fact Sheet for Healthcare Providers:  IncredibleEmployment.be  This test is no t yet approved or cleared by the [place] FDA and  has been authorized for detection and/or diagnosis of SARS-CoV-2 by FDA under an Emergency Use Authorization (EUA). This EUA will remain  in effect (meaning this test can be used) for the duration of the COVID-19 declaration under Section 564(b)(1) of the Act, 21 U.S.C.section 360bbb-3(b)(1), unless the authorization is terminated  or revoked sooner.       Influenza A by PCR  NEGATIVE NEGATIVE Final   Influenza B by PCR NEGATIVE NEGATIVE Final    Comment: (NOTE) The Xpert Xpress SARS-CoV-2/FLU/RSV plus assay is intended as an aid in the diagnosis of influenza from Nasopharyngeal swab specimens and should not be used as a sole basis for treatment. Nasal washings and aspirates are unacceptable for Xpert Xpress SARS-CoV-2/FLU/RSV testing.  Fact Sheet for Patients: EntrepreneurPulse.com.au  Fact Sheet for Healthcare Providers: IncredibleEmployment.be  This test is not yet approved or cleared by the [place] FDA and has been authorized for detection and/or diagnosis of SARS-CoV-2 by FDA under an Emergency Use Authorization (EUA). This EUA will remain in effect (meaning this test can be used) for the duration of the COVID-19 declaration under Section 564(b)(1) of the Act, 21 U.S.C. section 360bbb-3(b)(1), unless the authorization is terminated or revoked.  Performed at [facility], [place]  [address] [place], [place] [address]          Radiology Studies: No results found.      Scheduled Meds: . aspirin EC  81 mg Oral Daily  . atorvastatin  40 mg Oral Daily  . carvedilol  3.125 mg Oral BID WC  . enoxaparin (LOVENOX) injection  40 mg Subcutaneous Q24H  . furosemide  40 mg Oral Daily  . levothyroxine  100 mcg Oral Daily  . lisinopril  2.5 mg Oral Daily  . potassium chloride  20 mEq Oral Daily  . sodium chloride flush  3 mL Intravenous Q12H  . cyanocobalamin  1,000 mcg Oral Daily   Continuous Infusions: . sodium chloride      Assessment & Plan:   Principal Problem:   Acute CHF (congestive heart failure) (HCC) Active Problems:   Hypothyroidism   Goals of care, counseling/discussion   HLD (hyperlipidemia)   Acute respiratory failure with hypoxia (HCC)   Ascending aortic aneurysm (HCC)   Elevated troponin   Weakness   Palliative care by specialist   DNR (do not resuscitate)   Acute  hypoxic respiratory failuredue to acute systolic HF (congestive heart failure): Patient presented with acute shortness of breath,  found to have O2 saturation of 78% on room air. Weaned down on 02. CT nega for PE Echo with EF 25 to 30% with global hypokinesis 1/23-remains euvolemic on exam.  With ambulation O2 sat more than 94% Continue p.o. Lasix Continue carvedilol and lisinopril Consider addition of low-dose spironolactone as outpatient and follow-up       Elevated troponin: Could be secondary to demand ischemia. Cardiology does not think patient is having ACS 1/24 remains asymptomatic without chest pain  No plans for cardiac catheterization after discussion with family and patient due to his advanced age which felt to be reasonable with interventional cardiology  Continue medical management with beta-blockers, statin, aspirin as there is high suspicion for underlying CAD      Hypothyroidism Continue Synthroid   Cardiomyopathy- ischemic v.s. nonischemic EF 25 to 30% on echo  1/24-no plans for left heart cath as mentioned above Euvolemic on exam Continue Lasix, carvedilol, ACE inhibitors Addition of Aldactone as outpatient and follow-up      HLD (hyperlipidemia): continue statin  Ascending aortic aneurysm ([place]):Incidental finding by CT angiogram, 1 cm ascending aortic aneurysm -Follow-up with PCP     DVT prophylaxis: Lovenox Code Status: DNR Family Communication: Spoke to nephew  Status is: Inpatient  Remains inpatient appropriate because: Unsafe discharge  Dispo: The patient is from: independent living              Anticipated d/c is to: [facility]              Anticipated d/c date is: in am              Patient currently is medically stable. Will obtain cxr and covid for d/c tomorrow. Bed available at [facility] in am        LOS: 7 days   Time spent:35 min with >50% on coc    Nolberto Hanlon, MD [facility] Pager [phone]  [phone]  If 7PM-7AM, please contact night-coverage 01/17/2021, 1:40 PM

## 2021-01-17 NOTE — TOC Progression Note (Signed)
Transition of Care (TOC) - Progression Note    Patient Details  Name: Meshulem Onorato. Mondor MRN: [id number] Date of Birth: 1924-09-10  Transition of Care [facility]) CM/SW [place], RN Phone Number: 01/17/2021, 12:12 PM  Clinical Narrative: [facility] had [facility] who assessed patient for ALF, for discharge placement. Assessment was approved for ALF, however the bed will not be available until tomorrow for admission patient will need CXR to rule out TB, COVID test and a FL2 , to be faxed to [phone]. Attending and RN notified, I will fax results when results are available.         Expected Discharge Plan and Services                                                 Social Determinants of Health (SDOH) Interventions    Readmission Risk Interventions No flowsheet data found.

## 2021-01-17 NOTE — NC FL2 (Signed)
[place] LEVEL OF CARE SCREENING TOOL     IDENTIFICATION  Patient Name: Adam Bentley Birthdate: January 23, 1924 Sex: male Admission Date (Current Location): 01/10/2021  [place] and [place] Number:  Engineering geologist and Address:  [facility], [address] [place], [place] [address]      Provider Number: [id number]  Attending Physician Name and Address:  Nolberto Hanlon, MD  Relative Name and Phone Number:  Christy Gentles [phone]/ Vianne Bulls [phone]    Current Level of Care: Hospital Recommended Level of Care: [place] Prior Approval Number:    Date Approved/Denied:   PASRR Number: [id number]  Discharge Plan: Other (Comment)    Current Diagnoses: Patient Active Problem List   Diagnosis Date Noted  . Weakness   . Palliative care by specialist   . DNR (do not resuscitate)   . Acute CHF (congestive heart failure) ([place]) 01/10/2021  . HLD (hyperlipidemia) 01/10/2021  . Acute respiratory failure with hypoxia ([place]) 01/10/2021  . Ascending aortic aneurysm ([place]) 01/10/2021  . Elevated troponin 01/10/2021  . Malnutrition of mild degree ([place]) 12/24/2018  . Senile purpura ([place]) 05/22/2018  . 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 Advance directive discussed with patient 04/19/2015  . Goals of care, counseling/discussion 04/16/2013  . HEARING LOSS 01/29/2009  . Hypothyroidism 05/17/2007  . BPH without obstruction/lower urinary tract symptoms 05/17/2007  . COLON CANCER, HX OF 05/17/2007    Orientation RESPIRATION BLADDER Height & Weight     Self,Time,Situation  Normal Continent Weight: 60.4 kg Height:  5\' 8"  (172.7 cm)  BEHAVIORAL SYMPTOMS/MOOD NEUROLOGICAL BOWEL NUTRITION STATUS      Continent Diet  AMBULATORY STATUS COMMUNICATION OF NEEDS Skin   Limited Assist Verbally Normal                       Personal Care Assistance Level of Assistance  Bathing,Feeding,Dressing Bathing Assistance: Limited  assistance Feeding assistance: Independent Dressing Assistance: Limited assistance     Functional Limitations Info  Sight,Hearing,Speech Sight Info: Adequate Hearing Info: Adequate Speech Info: Adequate    SPECIAL CARE FACTORS FREQUENCY  PT (By licensed PT),OT (By licensed OT)     PT Frequency: 2x week OT Frequency: 2x week            Contractures Contractures Info: Not present    Additional Factors Info  Code Status,Allergies Code Status Info: DNR Allergies Info: None           Current Medications (01/17/2021):  This is the current hospital active medication list Current Facility-Administered Medications  Medication Dose Route Frequency Provider Last Rate Last Admin  . 0.9 %  sodium chloride infusion  250 mL Intravenous PRN Ivor Costa, MD      . acetaminophen (TYLENOL) tablet 650 mg  650 mg Oral Q6H PRN Ivor Costa, MD      . albuterol (VENTOLIN HFA) 108 (90 Base) MCG/ACT inhaler 2 puff  2 puff Inhalation Q4H PRN Ivor Costa, MD      . aspirin EC tablet 81 mg  81 mg Oral Daily Ivor Costa, MD   81 mg at 01/17/21 5093  . atorvastatin (LIPITOR) tablet 40 mg  40 mg Oral Daily Ivor Costa, MD   40 mg at 01/17/21 2671  . carvedilol (COREG) tablet 3.125 mg  3.125 mg Oral BID WC End, Harrell Gave, MD   3.125 mg at 01/17/21 2458  . dextromethorphan-guaiFENesin (MUCINEX DM) 30-600 MG per 12 hr tablet 1 tablet  1 tablet Oral  BID PRN Ivor Costa, MD      . enoxaparin (LOVENOX) injection 40 mg  40 mg Subcutaneous Q24H Ivor Costa, MD   40 mg at 01/17/21 2023  . furosemide (LASIX) tablet 40 mg  40 mg Oral Daily Christell Faith M, PA-C   40 mg at 01/17/21 3435  . levothyroxine (SYNTHROID) tablet 100 mcg  100 mcg Oral Daily Ivor Costa, MD   100 mcg at 01/17/21 0540  . lisinopril (ZESTRIL) tablet 2.5 mg  2.5 mg Oral Daily Ivor Costa, MD   2.5 mg at 01/17/21 6861  . ondansetron (ZOFRAN) injection 4 mg  4 mg Intravenous Q8H PRN Ivor Costa, MD      . potassium chloride SA (KLOR-CON) CR tablet 20 mEq   20 mEq Oral Daily Christell Faith M, PA-C   20 mEq at 01/17/21 6837  . sodium chloride flush (NS) 0.9 % injection 3 mL  3 mL Intravenous Q12H Ivor Costa, MD   3 mL at 01/17/21 0923  . sodium chloride flush (NS) 0.9 % injection 3 mL  3 mL Intravenous PRN Ivor Costa, MD      . vitamin B-12 (CYANOCOBALAMIN) tablet 1,000 mcg  1,000 mcg Oral Daily Ivor Costa, MD   1,000 mcg at 01/17/21 2902     Discharge Medications: Please see discharge summary for a list of discharge medications.  Relevant Imaging Results:  Relevant Lab Results:   Additional Information    Kerin Salen, RN

## 2021-01-17 NOTE — Care Management Important Message (Signed)
Important Message  Patient Details  Name: Adam Bentley. [name] MRN: [id number] Date of Birth: Nov 04, 1924   Medicare Important Message Given:  Yes     [name] 01/17/2021, 12:24 PM

## 2021-01-17 NOTE — Progress Notes (Signed)
Physical Therapy Treatment Patient Details Name: Adam Bentley. [place] MRN: [id number] DOB: 05/26/1924 Today's Date: 01/17/2021    History of Present Illness 87 y.o. male with medical history significant of hyperlipidemia, hypothyroidism, colon cancer, hearing loss, BPH, diplopia, who presents with shortness of breath for more than 3 days, which has been progressively worsening. Negative for PE, covid.    PT Comments    Pt transitions OOB with ease and is able to complete 4 laps around unit with RW and supervision.  No LOB or buckling but does need occasional cues to stay in walker box during turns.  Pt voices he feels strong and at his baseline for mobility.  ALF at [facility] with [place] remains appropriate for pt from a PT standpoint.     Follow Up Recommendations  Home health PT     Equipment Recommendations  Rolling walker with 5" wheels    Recommendations for Other Services       Precautions / Restrictions Precautions Precautions: Fall Precaution Comments: Pt with poor safety awareness Restrictions Weight Bearing Restrictions: No    Mobility  Bed Mobility Overal bed mobility: Independent                Transfers     Transfers: Sit to/from Stand Sit to Stand: Modified independent (Device/Increase time);Supervision            Ambulation/Gait Ambulation/Gait assistance: Supervision Gait Distance (Feet): 700 Feet Assistive device: Rolling walker (2 wheeled) Gait Pattern/deviations: Step-through pattern;Trunk flexed Gait velocity: decreased   General Gait Details: 4 laps around unit with ease.   Stairs             Wheelchair Mobility    Modified Rankin (Stroke Patients Only)       Balance Overall balance assessment: Needs assistance Sitting-balance support: No upper extremity supported;Feet supported Sitting balance-Leahy Scale: Normal     Standing balance support: Bilateral upper extremity supported Standing balance-Leahy Scale: Fair                               Cognition Arousal/Alertness: Awake/alert Behavior During Therapy: WFL for tasks assessed/performed Overall Cognitive Status: Within Functional Limits for tasks assessed                                        Exercises      General Comments        Pertinent Vitals/Pain Pain Assessment: No/denies pain    Home Living                      Prior Function            PT Goals (current goals can now be found in the care plan section) Progress towards PT goals: Progressing toward goals    Frequency    Min 2X/week      PT Plan Current plan remains appropriate    Co-evaluation              AM-PAC PT "6 Clicks" Mobility   Outcome Measure  Help needed turning from your back to your side while in a flat bed without using bedrails?: None Help needed moving from lying on your back to sitting on the side of a flat bed without using bedrails?: None Help needed moving to and from a bed to a chair (including a  wheelchair)?: None Help needed standing up from a chair using your arms (e.g., wheelchair or bedside chair)?: None Help needed to walk in hospital room?: A Little Help needed climbing 3-5 steps with a railing? : A Little 6 Click Score: 22    End of Session Equipment Utilized During Treatment: Gait belt Activity Tolerance: Patient tolerated treatment well Patient left: with call bell/phone within reach;with bed alarm set;in chair;with chair alarm set Nurse Communication: Mobility status;Other (comment) PT Visit Diagnosis: Muscle weakness (generalized) (M62.81);Difficulty in walking, not elsewhere classified (R26.2)     Time: 1400-1411 PT Time Calculation (min) (ACUTE ONLY): 11 min  Charges:  $Gait Training: 8-22 mins                    Chesley Noon, PTA 01/17/21, 2:19 PM

## 2021-01-17 NOTE — Consult Note (Signed)
Consultation Note Date: 01/17/2021   Patient Name: Adam Bentley  DOB: 1924/08/13  MRN: [id number]  Age / Sex: 87 y.o., male  PCP: Adam Carbon, MD Referring Physician: Nolberto Hanlon, MD  Reason for Consultation: Establishing goals of care  HPI/Patient Profile: 87 y.o. male  with past medical history of hyperlipidemia, hypothyroidism, colon cancer, hearing loss, BPH admitted on 01/10/2021 with shortness of breath, orthopnea, and weakness. Oxygen found to be 78% on room air, placed on NRB mask. In ED, BNP 1927. CXR interstitial edema. CTA ruled out PE but showed cardiomegaly and bilateral interstitial edema. Hospital admission for new onset CHF. Cardiology following. Received aggressive diuresis. Cardiology discussed with patient/family and no plans for cardiac catheterization due to advanced age. Plan is for conservative, medical management including aspirin as there is high suspicion for underlying CAD. ECHO revealed EF 25-30%. Palliative medicine consultation for goals of care.    Clinical Assessment and Goals of Care:  I have reviewed medical records, discussed with care team and initially spoke with patient's niece Adam Bentley) and nephew (Adam Bentley) via telephone to discuss goals of care.   I introduced Palliative Medicine as specialized medical care for people living with serious illness. It focuses on providing relief from the symptoms and stress of a serious illness. The goal is to improve quality of life for both the patient and the family.  Niece and nephew confirm they are Uncle Adam Bentley's HCPOA's. (documentation not on file). Prior to hospitalization, patient living at [facility]. Adam Bentley acknowledges he has become weaker and more short of breath on exertion.   Discussed events leading up to admission and course of hospitalization including diagnoses, interventions, plan of care. Confirmed cardiology plan  including NO invasive workup such as cardiac cath, but plans for medical management. Adam Bentley and Adam Bentley confirm this plan, also sharing that they spoke with Adam Bentley PCP (Adam Bentley) who also feels with his age, conservative management is best. Reviewed medications and plan of care.   Adam Bentley and Adam Bentley their concerns with Adam Bentley returning back to ILF on discharge. They have spoken with Adam Bentley who agrees he may be a candidate for short-term SNF rehab. Then family is planning to transition Adam Bentley to ALF at [facility].   Explained PT/OT recommendations for home health services but reassured family that I will advocate for SNF rehab placement if possible. (This was discussed with attending, RN CM, and PT).   I attempted to elicit values and goals of care important to the patient and family. Discussed code status and recommendations against aggressive measures as his condition worsens, not only because of age and frailty but weak heart. Adam Bentley and Adam Bentley confirm his decision for DNR and share he has a gold form in his apartment. Education provided and code status changed in Epic to DNR per family request. (DNR code status also confirmed with patient at bedside). Reassured of ongoing medical management.   Questions and concerns were addressed. PMT contact information given.   **Shortly after, spoke with Adam Bentley at bedside. He  is awake, alert, oriented and able to participate in discussion. He denies pain or shortness of breath. He is in good spirits this afternoon and agrees with plan for rehab if necessary. Discussed course of hospitalization and answered questions regarding his condition. Confirmed DNR code status with patient. Reassured of ongoing medical management.    SUMMARY OF RECOMMENDATIONS    DNR code status confirmed with patient and also niece and nephew. He has a durable DNR at home in his apartment.  Continue current plan of care and medical management.  Niece and nephew are reported  HCPOA's. Documentation not on file. Will request.  Pending disposition plan. SNF rehab vs. ALF with PT follow-up. Family voices concerns that he is very weak and needs SNF rehab. TOC team following.  May benefit from outpatient palliative referral on discharge. May benefit from MOST form completion. Copy of Hard Choices and MOST form left in patient belonging back for patient/niece/nephew to review when together.   Code Status/Advance Care Planning:  DNR  Symptom Management:   Per attending  Palliative Prophylaxis:   Aspiration, Bowel Regimen, Delirium Protocol and Frequent Pain Assessment  Psycho-social/Spiritual:   Desire for further Chaplaincy support:yes  Additional Recommendations: Caregiving  Support/Resources  Prognosis:   Poor long-term with acute systolic heart failure with EF 25-30%  Discharge Planning: Skilled Nursing Facility for rehab with Palliative care service follow-up      Primary Diagnoses: Present on Admission: . HLD (hyperlipidemia) . Hypothyroidism . Acute respiratory failure with hypoxia (HCC) . Ascending aortic aneurysm (HCC) . Elevated troponin   I have reviewed the medical record, interviewed the patient and family, and examined the patient. The following aspects are pertinent.  Past Medical History:  Diagnosis Date  . BPH (benign prostatic hypertrophy)   . Dental bridge present    permanent upper  . Diplopia 1986   concussion  . Hearing aid worn    bilateral  . History of colon cancer   . Hyperlipidemia   . Hypothyroidism   . Thyroid disease   . Wears dentures    full lower   Social History   Socioeconomic History  . Marital status: Single    Spouse name: Not on file  . Number of children: Not on file  . Years of education: Not on file  . Highest education level: Not on file  Occupational History  . 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 Smoking status: Never Smoker  .  Smokeless tobacco: Never Used  Substance and Sexual Activity  . Alcohol use: Yes    Comment: 2 beers/mo  . Drug use: No  . Sexual activity: Not on file  Other Topics Concern  . Not on file  Social History Narrative   Has living will    Requests niece Larita Fife (or nephew Gerlene Burdock) as health care power of attorney   Has DNR already from me   No tube feeds if cognitively unaware (unless clearly seems reversible)   Social Determinants of Corporate investment banker Strain: Not on file  Food Insecurity: Not on file  Transportation Needs: Not on file  Physical Activity: Not on file  Stress: Not on file  Social Connections: Not on file   Family History  Problem Relation Age of Onset  . Heart attack Mother 31  . Dementia Sister   . Lung cancer Brother    Scheduled Meds: . aspirin EC  81 mg Oral Daily  . atorvastatin  40 mg Oral Daily  . carvedilol  3.125 mg Oral BID WC  . enoxaparin (LOVENOX) injection  40 mg Subcutaneous Q24H  . furosemide  40 mg Oral Daily  . levothyroxine  100 mcg Oral Daily  . lisinopril  2.5 mg Oral Daily  . potassium chloride  20 mEq Oral Daily  . sodium chloride flush  3 mL Intravenous Q12H  . cyanocobalamin  1,000 mcg Oral Daily   Continuous Infusions: . sodium chloride     PRN Meds:.sodium chloride, acetaminophen, albuterol, dextromethorphan-guaiFENesin, ondansetron (ZOFRAN) IV, sodium chloride flush Medications Prior to Admission:  Prior to Admission medications   Medication Sig Start Date End Date Taking? Authorizing Provider  Apoaequorin (PREVAGEN PO) Take 1 tablet by mouth daily.   Yes [provider]  cyanocobalamin 1000 MCG tablet Take 1,000 mcg by mouth daily.   Yes [provider]  levothyroxine (SYNTHROID) 100 MCG tablet TAKE 1 TABLET BY MOUTH EVERY DAY 08/25/20  Yes Adam Carbon, MD  Multiple Vitamins-Minerals (OCUVITE PO) Take 1 tablet by mouth daily.   Yes [provider]   No Known Allergies Review of  Systems  All other systems reviewed and are negative.  Physical Exam Vitals and nursing note reviewed.  Constitutional:      General: He is awake.  HENT:     Head: Normocephalic and atraumatic.  Cardiovascular:     Rate and Rhythm: Regular rhythm.  Pulmonary:     Effort: No tachypnea, accessory muscle usage or respiratory distress.     Comments: Room air Skin:    General: Skin is warm and dry.  Neurological:     Mental Status: He is alert and oriented to person, place, and time.    Vital Signs: BP 115/60   Pulse 61   Temp 98.7 F (37.1 C)   Resp 18   Ht 5\' 8"  (1.727 m)   Wt 60.4 kg   SpO2 100%   BMI 20.25 kg/m  Pain Scale: 0-10 POSS *See Group Information*: 1-Acceptable,Awake and alert Pain Score: 0-No pain   SpO2: SpO2: 100 % O2 Device:SpO2: 100 % O2 Flow Rate: .   IO: Intake/output summary:   Intake/Output Summary (Last 24 hours) at 01/17/2021 1334 Last data filed at 01/17/2021 1045 Gross per 24 hour  Intake 720 ml  Output 1400 ml  Net -680 ml    LBM: Last BM Date: 01/16/21 Baseline Weight: Weight: 60.3 kg Most recent weight: Weight: 60.4 kg     Palliative Assessment/Data: PPS 50%   Flowsheet Rows   Flowsheet Row Most Recent Value  Intake Tab   Referral Department Hospitalist  Unit at Time of Referral Cardiac/Telemetry Unit  Palliative Care Primary Diagnosis Cardiac  Palliative Care Type New Palliative care  Reason for referral Clarify Goals of Care  Date first seen by Palliative Care 01/17/21  Clinical Assessment   Palliative Performance Scale Score 50%  Psychosocial & Spiritual Assessment   Palliative Care Outcomes   Patient/Family meeting held? Yes  Who was at the meeting? spoke with niece and nephew via telephone, patient  Palliative Care Outcomes Clarified goals of care, Provided psychosocial or spiritual support, Changed CPR status, ACP counseling assistance, Linked to palliative care logitudinal support       Time Total: 82 Greater  than 50%  of this time was spent counseling and coordinating care related to the above assessment and plan.  Signed by:  Ihor Dow, DNP, FNP-C Palliative Medicine Team  Phone: [phone] Fax: [phone]   Please  contact Palliative Medicine Team phone at [phone] for questions and concerns.  For individual provider: See Shea Evans

## 2021-01-17 NOTE — TOC Progression Note (Addendum)
Transition of Care (TOC) - Progression Note    Patient Details  Name: Adam Bentley MRN: [id number] Date of Birth: Sep 22, 1924  Transition of Care [facility]) CM/SW Contact  Kerin Salen, RN Phone Number: 01/17/2021, 9:13 AM  Clinical Narrative:  Attempted to call Adam Bentley  Adam Bentley, [phone] to discuss discharge plans, no answer left voice message to return call.  Adam Bentley returned call, voices speaking with Adam Bentley to confirm that patient can be transferred to SNF for Rehab and Medicare will pay for 21 days. Adam Bentley feels that this is best for patient due to weakness and heart condition. I advised Adam Bentley that I will keep him updated.   Adam Bentley returned call, we discussed the evaluation done by PT/OT recommendations for Home Health PT, not SNF, therefore Insurance will not cover, it will need to be out of pocket. Adam Bentley to call and discuss with  Adam Bentley.   Adam Bentley, Adam Bentley called back states [facility] will send Adam Bentley over to assess patient for ALF, he did not want patient to be discharged to [place] facility. Adam Bentley did come and approved patient for ALF, Attending and Nurse notified, will need CXR to rule out TB, FL2 and COVID test faxed to [phone] for discharge tomorrow, 11/18/2021. Adam Bentley states he will communicate results and plan to [employer] and family.         Expected Discharge Plan and Services                                                 Social Determinants of Health (SDOH) Interventions    Readmission Risk Interventions No flowsheet data found.

## 2021-01-17 NOTE — Progress Notes (Signed)
PMT consult received and chart reviewed. Met with patient at bedside. He is awake, alert, oriented and in good spirits. He understands condition and plan of care. Adam Bentley confirms DNR code status.   Also spoke with niece Adam Bentley) and nephew (Adam Bentley) via telephone. Discussed course of hospitalization and plan of care. Family understands conservative management for heart failure. Adam Bentley and Rich confirm their Uncle's wish for DNR code status. Order placed. Reassured of ongoing medical management inpatient.   Adam Bentley and Adam Bentley are very concerned about discharge plan and feel Adam Bentley needs to discharge to SNF rehab. They then plan to transition him for ILF to ALF. Reassured family that I will update care team on their request. RN CM following. Also will have PT re-evaluate per family request. Outpatient palliative on discharge.   Full palliative note to follow.   NO CHARGE  Ihor Dow, [place], FNP-C Palliative Medicine Team  Phone: [phone] Fax: [phone]

## 2021-01-18 ENCOUNTER — Telehealth: Payer: Self-pay

## 2021-01-18 DIAGNOSIS — Z66 Do not resuscitate: Secondary | ICD-10-CM

## 2021-01-18 DIAGNOSIS — I5021 Acute systolic (congestive) heart failure: Secondary | ICD-10-CM | POA: Diagnosis not present

## 2021-01-18 DIAGNOSIS — I712 Thoracic aortic aneurysm, without rupture: Secondary | ICD-10-CM | POA: Diagnosis not present

## 2021-01-18 DIAGNOSIS — J9601 Acute respiratory failure with hypoxia: Secondary | ICD-10-CM | POA: Diagnosis not present

## 2021-01-18 MED ORDER — LISINOPRIL 2.5 MG PO TABS: 2.5000 mg | ORAL_TABLET | Freq: Every day | ORAL | 0 refills | Status: AC

## 2021-01-18 MED ORDER — CARVEDILOL 3.125 MG PO TABS: 3.1250 mg | ORAL_TABLET | Freq: Two times a day (BID) | ORAL | 0 refills | Status: AC

## 2021-01-18 MED ORDER — ATORVASTATIN CALCIUM 40 MG PO TABS: 40.0000 mg | ORAL_TABLET | Freq: Every day | ORAL | 0 refills | Status: AC

## 2021-01-18 MED ORDER — ASPIRIN 81 MG PO TBEC: 81.0000 mg | DELAYED_RELEASE_TABLET | Freq: Every day | ORAL | 11 refills | Status: AC

## 2021-01-18 MED ORDER — POTASSIUM CHLORIDE CRYS ER 20 MEQ PO TBCR: 20.0000 meq | EXTENDED_RELEASE_TABLET | Freq: Every day | ORAL | 0 refills | Status: AC

## 2021-01-18 MED ORDER — FUROSEMIDE 40 MG PO TABS: 40.0000 mg | ORAL_TABLET | Freq: Every day | ORAL | 0 refills | Status: AC

## 2021-01-18 NOTE — TOC Transition Note (Signed)
Transition of Care [facility]) - CM/SW Discharge Note   Patient Details  Name: Adam Bentley MRN: [id number] Date of Birth: July 27, 1924  Transition of Care [facility]) CM/SW Contact:  Kerin Salen, RN Phone Number: 01/18/2021, 10:42 AM   Clinical Narrative:  Patient scheduled for discharge to [facility], Room 205. Will have Home Health PT, which will be arranged by Lauretta Chester, representative at [facility]. TOC barriers resolved.     Final next level of care: Assisted Living Barriers to Discharge: Barriers Resolved   Patient Goals and CMS Choice Patient states their goals for this hospitalization and ongoing recovery are:: Return to home.   Choice offered to / list presented to : NA  Discharge Placement                Patient to be transferred to facility by: [facility] Name of family member notified: Neice and Nephrew Christy Gentles and [place] Patient and family notified of of transfer: 01/18/21  Discharge Plan and Services                          HH Arranged: PT [facility] Agency: Other - See comment Kandis Mannan will make arrangements.) Date HH Agency Contacted: 01/18/21 Time [place]: 1039 Representative spoke with at [place]: Ed Weeks of [place] Determinants of Health ([place]) Interventions     Readmission Risk Interventions No flowsheet data found.

## 2021-01-18 NOTE — Discharge Summary (Addendum)
Adam Bentley MWN:[id number] DOB: 07-15-1924 DOA: 01/10/2021  PCP: Venia Carbon, MD  Admit date: 01/10/2021 Discharge date: 01/18/2021  Admitted From: Independent living Disposition:  Assisted living with [facility]  Recommendations for Outpatient Follow-up:  1. Follow up with PCP in 1 week 2. Please obtain BMP/CBC in one week 3. Cardiology in 1 week     Discharge Condition:Stable CODE STATUS: DNR Diet recommendation: Heart Healthy   Brief/Interim Summary: Per HPI: Adam Bentley is a 87 y.o. male with medical history significant of hyperlipidemia, hypothyroidism, colon cancer, hearing loss, BPH, diplopia, who presents with shortness of breath.  On admission patient was found to have negative COVID PCR, BNP 1927,  chest x-ray showed interstitial edema.  CT angiogram was negative for PE, but showed cardiomegaly and bilateral interstitial edema.  Cardiology was consulted .  He was found with acute respiratory failure due to acute systolic heart failure.  CTA of chest:    No evidence of pulmonary embolism.   Cardiomegaly, diffuse interstitial infiltrates, bilateral pleural effusions, suspicious for congestive heart failure.  Bilateral calcified pleural plaque, consistent with asbestos related pleural disease.  4.1 cm ascending thoracic aortic aneurysm.   Acutehypoxicrespiratory failuredue to acute systolic HF (congestive heart failure): Patient presented with acute shortness of breath,found to have O2 saturation of 78% on room air. Weaned down on 02 to room air, With ambulation 02 sats in 90's. CT nega for PE Echo with EF 25 to 30% with global hypokinesis Was diuresed with IV Lasix and switch to p.o.   Continue carvedilol and lisinopril  Consider addition of low-dose spironolactone as outpatient and follow-up       Elevated troponin: Could be secondary to demand ischemia. Cardiology does not think patient is having ACS  remains asymptomatic without  chest pain  No plans for cardiac catheterization after discussion with family and patient due to his advanced age which felt to be reasonable with interventional cardiology  Continue medical management with beta-blockers, statin, aspirin as there is high suspicion for underlying CAD      Hypothyroidism Continue Synthroid   Cardiomyopathy- ischemic v.s. nonischemic EF 25 to 30% on echo  no plans for left heart cath as mentioned above Euvolemic on exam Continue Lasix, carvedilol, ACE inhibitors Addition of Aldactone as outpatient and follow-up Check bmp in couple of days from discharge      HLD (hyperlipidemia):continue statin  Ascending aortic aneurysm ([place]):Incidental finding by CT angiogram, 1 cm ascending aortic aneurysm -Follow-up with PCP      Discharge Diagnoses:  Principal Problem:   Acute CHF (congestive heart failure) (HCC) Active Problems:   Hypothyroidism   Goals of care, counseling/discussion   HLD (hyperlipidemia)   Acute respiratory failure with hypoxia (HCC)   Ascending aortic aneurysm (HCC)   Elevated troponin   Weakness   Palliative care by specialist   DNR (do not resuscitate)    Discharge Instructions   Allergies as of 01/18/2021   No Known Allergies     Medication List    TAKE these medications   aspirin 81 MG EC tablet Take 1 tablet (81 mg total) by mouth daily. Swallow whole.   atorvastatin 40 MG tablet Commonly known as: LIPITOR Take 1 tablet (40 mg total) by mouth daily.   carvedilol 3.125 MG tablet Commonly known as: COREG Take 1 tablet (3.125 mg total) by mouth 2 (two) times daily with a meal.   cyanocobalamin 1000 MCG tablet Take 1,000 mcg by mouth daily.   furosemide 40 MG  tablet Commonly known as: LASIX Take 1 tablet (40 mg total) by mouth daily.   levothyroxine 100 MCG tablet Commonly known as: SYNTHROID TAKE 1 TABLET BY MOUTH EVERY DAY   lisinopril 2.5 MG tablet Commonly known as:  ZESTRIL Take 1 tablet (2.5 mg total) by mouth daily.   OCUVITE PO Take 1 tablet by mouth daily.   potassium chloride SA 20 MEQ tablet Commonly known as: KLOR-CON Take 1 tablet (20 mEq total) by mouth daily.   PREVAGEN PO Take 1 tablet by mouth daily.            Durable Medical Equipment  (From admission, onward)         Start     Ordered   01/15/21 1605  For home use only DME oxygen  Once       Question Answer Comment  Length of Need Lifetime   Mode or (Route) Nasal cannula   Liters per Minute 2   Frequency Continuous (stationary and portable oxygen unit needed)   Oxygen conserving device Yes   Oxygen delivery system Gas      01/15/21 1604          Follow-up Information    [facility] Follow up on 01/25/2021.   Specialty: Cardiology Why: at 11:30am. Enter through the [place] entrance Contact information: [address] [place] [phone]       Rise Mu, PA-C On 01/24/2021.   Specialties: Physician Assistant, Cardiology, Radiology Why: @ 2pm Contact information: [address] [address] [phone]        Venia Carbon, MD Follow up in 1 week(s).   Specialties: Internal Medicine, Pediatrics Contact information: [place] [address] [phone]              No Known Allergies  Consultations:  cardiology   Procedures/Studies: DG Chest 2 View  Result Date: 01/10/2021 CLINICAL DATA:  Shortness of breath for 3 days.  Nonsmoker. EXAM: CHEST - 2 VIEW COMPARISON:  Radiographs 12/24/2018. FINDINGS: The heart size is stable at the upper limits of normal. There is aortic atherosclerosis and tortuosity. Compared with the prior study, there are lower lung volumes with increased diffuse subpleural reticulation, central airway thickening and architectural distortion. There is mild blunting of both  costophrenic angles without significant pleural effusion or pneumothorax. There is a mild compression deformity near the thoracolumbar junction which does not appear acute. IMPRESSION: Lower lung volumes with increased diffuse subpleural reticulation, central airway thickening and architectural distortion. Findings may reflect progressive interstitial lung disease or superimposed atypical infection, including viral pneumonia. No focal airspace disease. Electronically Signed   By: Richardean Sale M.D.   On: 01/10/2021 12:53   CT Angio Chest PE W and/or Wo Contrast  Result Date: 01/10/2021 CLINICAL DATA:  Shortness of breath for 3 days. High clinical probability for pulmonary embolism. EXAM: CT ANGIOGRAPHY CHEST WITH CONTRAST TECHNIQUE: Multidetector CT imaging of the chest was performed using the standard protocol during bolus administration of intravenous contrast. Multiplanar CT image reconstructions and MIPs were obtained to evaluate the vascular anatomy. CONTRAST:  82mL OMNIPAQUE IOHEXOL 350 MG/ML SOLN COMPARISON:  None. FINDINGS: Cardiovascular: Satisfactory opacification of pulmonary arteries noted, and no pulmonary emboli identified. 4.1 cm aneurysm of the ascending thoracic aorta noted. Aortic and coronary atherosclerotic calcification noted. Mild cardiomegaly with left ventricular enlargement. Mediastinum/Nodes: No masses or pathologically enlarged lymph nodes identified. Lungs/Pleura:  Diffuse interstitial infiltrates are seen, suspicious for interstitial edema. Small to moderate bilateral pleural effusions are also seen. No evidence of pulmonary consolidation or mass. Calcified pleural plaque is seen bilaterally, consistent with asbestos related pleural disease. Upper abdomen: No acute findings. Musculoskeletal: No suspicious bone lesions identified. Review of the MIP images confirms the above findings. IMPRESSION: No evidence of pulmonary embolism. Cardiomegaly, diffuse interstitial infiltrates,  bilateral pleural effusions, suspicious for congestive heart failure. Bilateral calcified pleural plaque, consistent with asbestos related pleural disease. 4.1 cm ascending thoracic aortic aneurysm. Recommend annual imaging followup by CTA or MRA. This recommendation follows 2010 ACCF/AHA/AATS/ACR/ASA/SCA/SCAI/SIR/STS/SVM Guidelines for the Diagnosis and Management of Patients with Thoracic Aortic Disease. Circulation. 2010; [id number]. Aortic aneurysm NOS (ICD10-I71.9) Aortic Atherosclerosis (ICD10-I70.0). Electronically Signed   By: Marlaine Hind M.D.   On: 01/10/2021 14:33   DG Chest Port 1 View  Result Date: 01/17/2021 CLINICAL DATA:  TB screening. EXAM: PORTABLE CHEST 1 VIEW COMPARISON:  January 10, 2021 FINDINGS: Trachea midline. Heart size remaining enlarged. Cardiomediastinal contours are stable compared to recent comparison imaging. Hilar structures are normal. Signs of pleural plaques in the bilateral chest with similar appearance compared to very recent radiography and CT imaging. Question of pleural effusion though better demonstrated on recent CT imaging. Background of increased interstitial markings with similar appearance. No new lobar consolidation or sign of cavitation. On limited assessment no acute skeletal process. IMPRESSION: Stigmata of heart failure with similar appearance, associated with background chronic changes and pleural plaques as described. Electronically Signed   By: Zetta Bills M.D.   On: 01/17/2021 14:05   ECHOCARDIOGRAM COMPLETE  Result Date: 01/11/2021    ECHOCARDIOGRAM REPORT   Patient Name:   Adam Bentley Date of Exam: 01/11/2021 Medical Rec #:  [id number]         Height:       68.0 in Accession #:    [id number]        Weight:       133.0 lb Date of Birth:  1924-11-13         BSA:          1.718 m Patient Age:    72 years          BP:           129/83 mmHg Patient Gender: M                 HR:           71 bpm. Exam Location:  [facility] Procedure: 2D Echo, Color  Doppler and Cardiac Doppler Indications:     [id number] CHF-Acute Systolic  History:         Patient has no prior history of Echocardiogram examinations.                  Risk Factors:Dyslipidemia.  Sonographer:     Charmayne Sheer RDCS (AE) Referring Phys:  Baker Janus Soledad Gerlach NIU Diagnosing Phys: Nelva Bush MD  Sonographer Comments: Suboptimal parasternal window. Image acquisition challenging due to patient body habitus. Global longitudinal strain was attempted. IMPRESSIONS  1. Left ventricular ejection fraction, by estimation, is 25 to 30%. The left ventricle has severely decreased function. The left ventricle demonstrates global hypokinesis. There is mild left ventricular hypertrophy. Left ventricular diastolic parameters  are consistent with Grade II diastolic dysfunction (pseudonormalization). Elevated left atrial pressure. The average left ventricular global longitudinal strain is -7.3 %. The global longitudinal strain is abnormal.  2. Right ventricular systolic function is low  normal. The right ventricular size is normal. Tricuspid regurgitation signal is inadequate for assessing PA pressure.  3. Left atrial size was mildly dilated.  4. The mitral valve is degenerative. Mild to moderate mitral valve regurgitation. No evidence of mitral stenosis.  5. The aortic valve is tricuspid. There is moderate calcification of the aortic valve. There is moderate thickening of the aortic valve. Aortic valve regurgitation is mild to moderate. Mild to moderate aortic valve sclerosis/calcification is present, without any evidence of aortic stenosis.  6. Mildly dilated pulmonary artery.  7. The inferior vena cava is dilated in size with <50% respiratory variability, suggesting right atrial pressure of 15 mmHg. FINDINGS  Left Ventricle: Left ventricular ejection fraction, by estimation, is 25 to 30%. The left ventricle has severely decreased function. The left ventricle demonstrates global hypokinesis. The average left ventricular global  longitudinal strain is -7.3 %. The global longitudinal strain is abnormal. The left ventricular internal cavity size was normal in size. There is mild left ventricular hypertrophy. Left ventricular diastolic parameters are consistent with Grade II diastolic dysfunction (pseudonormalization). Elevated left atrial pressure. Right Ventricle: The right ventricular size is normal. No increase in right ventricular wall thickness. Right ventricular systolic function is low normal. Tricuspid regurgitation signal is inadequate for assessing PA pressure. Left Atrium: Left atrial size was mildly dilated. Right Atrium: Right atrial size was normal in size. Pericardium: There is no evidence of pericardial effusion. Mitral Valve: The mitral valve is degenerative in appearance. There is mild thickening of the mitral valve leaflet(s). There is mild calcification of the mitral valve leaflet(s). Mild mitral annular calcification. Mild to moderate mitral valve regurgitation. No evidence of mitral valve stenosis. MV peak gradient, 2.2 mmHg. The mean mitral valve gradient is 1.0 mmHg. Tricuspid Valve: The tricuspid valve is not well visualized. Tricuspid valve regurgitation is mild. Aortic Valve: The aortic valve is tricuspid. There is moderate calcification of the aortic valve. There is moderate thickening of the aortic valve. Aortic valve regurgitation is mild to moderate. Aortic regurgitation PHT measures 421 msec. Mild to moderate aortic valve sclerosis/calcification is present, without any evidence of aortic stenosis. Aortic valve mean gradient measures 2.0 mmHg. Aortic valve peak gradient measures 6.0 mmHg. Aortic valve area, by VTI measures 2.46 cm. Pulmonic Valve: The pulmonic valve was grossly normal. Pulmonic valve regurgitation is trivial. No evidence of pulmonic stenosis. Aorta: The aortic root and ascending aorta are structurally normal, with no evidence of dilitation. Pulmonary Artery: The pulmonary artery is mildly  dilated. Venous: The inferior vena cava is dilated in size with less than 50% respiratory variability, suggesting right atrial pressure of 15 mmHg. IAS/Shunts: No atrial level shunt detected by color flow Doppler.  LEFT VENTRICLE PLAX 2D LVIDd:         5.45 cm  Diastology LVIDs:         4.45 cm  LV e' medial:    3.15 cm/s LV PW:         1.22 cm  LV E/e' medial:  20.8 LV IVS:        1.16 cm  LV e' lateral:   7.40 cm/s LVOT diam:     2.10 cm  LV E/e' lateral: 8.9 LV SV:         50 LV SV Index:   29       2D Longitudinal Strain LVOT Area:     3.46 cm 2D Strain GLS Avg:     -7.3 %  RIGHT VENTRICLE RV Basal diam:  3.28 cm TAPSE (M-mode): 1.9 cm LEFT ATRIUM             Index       RIGHT ATRIUM           Index LA diam:        5.00 cm 2.91 cm/m  RA Area:     15.20 cm LA Vol (A2C):   52.4 ml 30.49 ml/m RA Volume:   37.00 ml  21.53 ml/m LA Vol (A4C):   69.4 ml 40.39 ml/m LA Biplane Vol: 64.4 ml 37.48 ml/m  AORTIC VALVE                   PULMONIC VALVE AV Area (Vmax):    2.54 cm    PV Vmax:       0.90 m/s AV Area (Vmean):   2.84 cm    PV Vmean:      61.100 cm/s AV Area (VTI):     2.46 cm    PV VTI:        0.153 m AV Vmax:           122.00 cm/s PV Peak grad:  3.3 mmHg AV Vmean:          72.100 cm/s PV Mean grad:  2.0 mmHg AV VTI:            0.201 m AV Peak Grad:      6.0 mmHg AV Mean Grad:      2.0 mmHg LVOT Vmax:         89.40 cm/s LVOT Vmean:        59.100 cm/s LVOT VTI:          0.143 m LVOT/AV VTI ratio: 0.71 AI PHT:            421 msec  AORTA Ao Root diam: 3.70 cm Ao Asc diam:  3.30 cm MITRAL VALVE MV Area (PHT): 4.93 cm    SHUNTS MV Area VTI:   2.39 cm    Systemic VTI:  0.14 m MV Peak grad:  2.2 mmHg    Systemic Diam: 2.10 cm MV Mean grad:  1.0 mmHg MV Vmax:       0.75 m/s MV Vmean:      50.8 cm/s MV Decel Time: 154 msec MV E velocity: 65.60 cm/s MV A velocity: 54.40 cm/s MV E/A ratio:  1.21 Christopher End MD Electronically signed by Nelva Bush MD Signature Date/Time: 01/11/2021/10:16:33 AM    Final         Subjective: No sob or cp  Discharge Exam: Vitals:   01/18/21 0333 01/18/21 0815  BP: 132/75 131/67  Pulse: 68 60  Resp: 18 16  Temp: 98.2 F (36.8 C) 97.7 F (36.5 C)  SpO2: 95% 96%   Vitals:   01/17/21 1944 01/18/21 0300 01/18/21 0333 01/18/21 0815  BP: 129/68  132/75 131/67  Pulse: (!) 59  68 60  Resp: 18  18 16   Temp: 97.9 F (36.6 C)  98.2 F (36.8 C) 97.7 F (36.5 C)  TempSrc: Oral   Oral  SpO2: 98%  95% 96%  Weight:  60.3 kg    Height:        General: Pt is alert, awake, not in acute distress Cardiovascular: RRR, S1/S2 +, no rubs, no gallops Respiratory: CTA bilaterally, no wheezing, no rhonchi Abdominal: Soft, NT, ND, bowel sounds + Extremities: no edema, no cyanosis    The results of significant diagnostics from this hospitalization (including imaging, microbiology, ancillary  and laboratory) are listed below for reference.     Microbiology: Recent Results (from the past 240 hour(s))  Resp Panel by RT-PCR (Flu A&B, Covid) Nasopharyngeal Swab     Status: None   Collection Time: 01/10/21 11:36 AM   Specimen: Nasopharyngeal Swab; Nasopharyngeal(NP) swabs in vial transport medium  Result Value Ref Range Status   SARS Coronavirus 2 by RT PCR NEGATIVE NEGATIVE Final    Comment: (NOTE) SARS-CoV-2 target nucleic acids are NOT DETECTED.  The SARS-CoV-2 RNA is generally detectable in upper respiratory specimens during the acute phase of infection. The lowest concentration of SARS-CoV-2 viral copies this assay can detect is 138 copies/mL. A negative result does not preclude SARS-Cov-2 infection and should not be used as the sole basis for treatment or other patient management decisions. A negative result may occur with  improper specimen collection/handling, submission of specimen other than nasopharyngeal swab, presence of viral mutation(s) within the areas targeted by this assay, and inadequate number of viral copies(<138 copies/mL). A negative  result must be combined with clinical observations, patient history, and epidemiological information. The expected result is Negative.  Fact Sheet for Patients:  EntrepreneurPulse.com.au  Fact Sheet for Healthcare Providers:  IncredibleEmployment.be  This test is no t yet approved or cleared by the [place] FDA and  has been authorized for detection and/or diagnosis of SARS-CoV-2 by FDA under an Emergency Use Authorization (EUA). This EUA will remain  in effect (meaning this test can be used) for the duration of the COVID-19 declaration under Section 564(b)(1) of the Act, 21 U.S.C.section 360bbb-3(b)(1), unless the authorization is terminated  or revoked sooner.       Influenza A by PCR NEGATIVE NEGATIVE Final   Influenza B by PCR NEGATIVE NEGATIVE Final    Comment: (NOTE) The Xpert Xpress SARS-CoV-2/FLU/RSV plus assay is intended as an aid in the diagnosis of influenza from Nasopharyngeal swab specimens and should not be used as a sole basis for treatment. Nasal washings and aspirates are unacceptable for Xpert Xpress SARS-CoV-2/FLU/RSV testing.  Fact Sheet for Patients: EntrepreneurPulse.com.au  Fact Sheet for Healthcare Providers: IncredibleEmployment.be  This test is not yet approved or cleared by the [place] FDA and has been authorized for detection and/or diagnosis of SARS-CoV-2 by FDA under an Emergency Use Authorization (EUA). This EUA will remain in effect (meaning this test can be used) for the duration of the COVID-19 declaration under Section 564(b)(1) of the Act, 21 U.S.C. section 360bbb-3(b)(1), unless the authorization is terminated or revoked.  Performed at [facility], [address] [place], [place] [address]   SARS Coronavirus 2 by RT PCR (hospital order, performed in [facility] hospital lab) Nasopharyngeal Nasopharyngeal Swab     Status: None   Collection  Time: 01/17/21  2:38 PM   Specimen: Nasopharyngeal Swab  Result Value Ref Range Status   SARS Coronavirus 2 NEGATIVE NEGATIVE Final    Comment: (NOTE) SARS-CoV-2 target nucleic acids are NOT DETECTED.  The SARS-CoV-2 RNA is generally detectable in upper and lower respiratory specimens during the acute phase of infection. The lowest concentration of SARS-CoV-2 viral copies this assay can detect is 250 copies / mL. A negative result does not preclude SARS-CoV-2 infection and should not be used as the sole basis for treatment or other patient management decisions.  A negative result may occur with improper specimen collection / handling, submission of specimen other than nasopharyngeal swab, presence of viral mutation(s) within the areas targeted by this assay, and inadequate number of  viral copies (<250 copies / mL). A negative result must be combined with clinical observations, patient history, and epidemiological information.  Fact Sheet for Patients:   StrictlyIdeas.no  Fact Sheet for Healthcare Providers: BankingDealers.co.za  This test is not yet approved or  cleared by the [place] FDA and has been authorized for detection and/or diagnosis of SARS-CoV-2 by FDA under an Emergency Use Authorization (EUA).  This EUA will remain in effect (meaning this test can be used) for the duration of the COVID-19 declaration under Section 564(b)(1) of the Act, 21 U.S.C. section 360bbb-3(b)(1), unless the authorization is terminated or revoked sooner.  Performed at [facility], [address] [place], [place] [address]      Labs: BNP (last 3 results) Recent Labs    01/10/21 1135 01/13/21 0520  BNP 1,927.9* 123XX123*   Basic Metabolic Panel: Recent Labs  Lab 01/12/21 0645 01/13/21 0620 01/14/21 0414 01/15/21 0648 01/17/21 0618  NA 138 135 132* 134*  --   K 3.9 3.8 4.2 3.7 4.7  CL 101 98 93* 96*  --   CO2 26 29  30 29   --   GLUCOSE 90 84 102* 95  --   BUN 28* 31* 38* 35*  --   CREATININE 0.92 0.90 1.16 0.88  --   CALCIUM 9.7 9.7 10.0 9.7  --   MG 2.3  --   --   --   --   PHOS 2.9  --   --   --   --    Liver Function Tests: No results for input(s): AST, ALT, ALKPHOS, BILITOT, PROT, ALBUMIN in the last 168 hours. No results for input(s): LIPASE, AMYLASE in the last 168 hours. No results for input(s): AMMONIA in the last 168 hours. CBC: Recent Labs  Lab 01/12/21 0645  WBC 6.3  HGB 14.2  HCT 42.4  MCV 98.6  PLT 211   Cardiac Enzymes: No results for input(s): CKTOTAL, CKMB, CKMBINDEX, TROPONINI in the last 168 hours. BNP: Invalid input(s): POCBNP CBG: No results for input(s): GLUCAP in the last 168 hours. D-Dimer No results for input(s): DDIMER in the last 72 hours. Hgb A1c No results for input(s): HGBA1C in the last 72 hours. Lipid Profile No results for input(s): CHOL, HDL, LDLCALC, TRIG, CHOLHDL, LDLDIRECT in the last 72 hours. Thyroid function studies No results for input(s): TSH, T4TOTAL, T3FREE, THYROIDAB in the last 72 hours.  Invalid input(s): FREET3 Anemia work up No results for input(s): VITAMINB12, FOLATE, FERRITIN, TIBC, IRON, RETICCTPCT in the last 72 hours. Urinalysis No results found for: COLORURINE, APPEARANCEUR, [place], [place], GLUCOSEU, [place], [place], [place], PROTEINUR, UROBILINOGEN, NITRITE, LEUKOCYTESUR Sepsis Labs Invalid input(s): PROCALCITONIN,  WBC,  LACTICIDVEN Microbiology Recent Results (from the past 240 hour(s))  Resp Panel by RT-PCR (Flu A&B, Covid) Nasopharyngeal Swab     Status: None   Collection Time: 01/10/21 11:36 AM   Specimen: Nasopharyngeal Swab; Nasopharyngeal(NP) swabs in vial transport medium  Result Value Ref Range Status   SARS Coronavirus 2 by RT PCR NEGATIVE NEGATIVE Final    Comment: (NOTE) SARS-CoV-2 target nucleic acids are NOT DETECTED.  The SARS-CoV-2 RNA is generally detectable in upper respiratory specimens during  the acute phase of infection. The lowest concentration of SARS-CoV-2 viral copies this assay can detect is 138 copies/mL. A negative result does not preclude SARS-Cov-2 infection and should not be used as the sole basis for treatment or other patient management decisions. A negative result may occur with  improper specimen collection/handling, submission of  specimen other than nasopharyngeal swab, presence of viral mutation(s) within the areas targeted by this assay, and inadequate number of viral copies(<138 copies/mL). A negative result must be combined with clinical observations, patient history, and epidemiological information. The expected result is Negative.  Fact Sheet for Patients:  EntrepreneurPulse.com.au  Fact Sheet for Healthcare Providers:  IncredibleEmployment.be  This test is no t yet approved or cleared by the [place] FDA and  has been authorized for detection and/or diagnosis of SARS-CoV-2 by FDA under an Emergency Use Authorization (EUA). This EUA will remain  in effect (meaning this test can be used) for the duration of the COVID-19 declaration under Section 564(b)(1) of the Act, 21 U.S.C.section 360bbb-3(b)(1), unless the authorization is terminated  or revoked sooner.       Influenza A by PCR NEGATIVE NEGATIVE Final   Influenza B by PCR NEGATIVE NEGATIVE Final    Comment: (NOTE) The Xpert Xpress SARS-CoV-2/FLU/RSV plus assay is intended as an aid in the diagnosis of influenza from Nasopharyngeal swab specimens and should not be used as a sole basis for treatment. Nasal washings and aspirates are unacceptable for Xpert Xpress SARS-CoV-2/FLU/RSV testing.  Fact Sheet for Patients: EntrepreneurPulse.com.au  Fact Sheet for Healthcare Providers: IncredibleEmployment.be  This test is not yet approved or cleared by the [place] FDA and has been authorized for detection and/or  diagnosis of SARS-CoV-2 by FDA under an Emergency Use Authorization (EUA). This EUA will remain in effect (meaning this test can be used) for the duration of the COVID-19 declaration under Section 564(b)(1) of the Act, 21 U.S.C. section 360bbb-3(b)(1), unless the authorization is terminated or revoked.  Performed at [facility], [address] [place], [place] [address]   SARS Coronavirus 2 by RT PCR (hospital order, performed in [facility] hospital lab) Nasopharyngeal Nasopharyngeal Swab     Status: None   Collection Time: 01/17/21  2:38 PM   Specimen: Nasopharyngeal Swab  Result Value Ref Range Status   SARS Coronavirus 2 NEGATIVE NEGATIVE Final    Comment: (NOTE) SARS-CoV-2 target nucleic acids are NOT DETECTED.  The SARS-CoV-2 RNA is generally detectable in upper and lower respiratory specimens during the acute phase of infection. The lowest concentration of SARS-CoV-2 viral copies this assay can detect is 250 copies / mL. A negative result does not preclude SARS-CoV-2 infection and should not be used as the sole basis for treatment or other patient management decisions.  A negative result may occur with improper specimen collection / handling, submission of specimen other than nasopharyngeal swab, presence of viral mutation(s) within the areas targeted by this assay, and inadequate number of viral copies (<250 copies / mL). A negative result must be combined with clinical observations, patient history, and epidemiological information.  Fact Sheet for Patients:   StrictlyIdeas.no  Fact Sheet for Healthcare Providers: BankingDealers.co.za  This test is not yet approved or  cleared by the [place] FDA and has been authorized for detection and/or diagnosis of SARS-CoV-2 by FDA under an Emergency Use Authorization (EUA).  This EUA will remain in effect (meaning this test can be used) for the duration of  the COVID-19 declaration under Section 564(b)(1) of the Act, 21 U.S.C. section 360bbb-3(b)(1), unless the authorization is terminated or revoked sooner.  Performed at [facility], [address] [place], [place] [address]      Time coordinating discharge: Over 30 minutes  SIGNED:   Nolberto Hanlon, MD  [facility] 01/18/2021, 8:53 AM Pager   If 7PM-7AM, please  contact night-coverage [email or web address] Password [email or web address]

## 2021-01-18 NOTE — Telephone Encounter (Signed)
Transition Care Management Unsuccessful Follow-up Telephone Call  Date of discharge and from where:  01/18/2021, [facility]  Attempts:  1st Attempt  Reason for unsuccessful TCM follow-up call:  Unable to leave message voicemail box not setup

## 2021-01-18 NOTE — Progress Notes (Signed)
Mobility Specialist - Progress Note   01/18/21 1000  Mobility  Activity Ambulated in hall  Level of Assistance Standby assist, set-up cues, supervision of patient - no hands on  Assistive Device Front wheel walker  Distance Ambulated (ft) 480 ft  Mobility Response Tolerated well  Mobility performed by Mobility specialist  $Mobility charge 1 Mobility    Pre-mobility: 64 HR, 95% SpO2 During mobility: 80 HR, 96% SpO2 Post-mobility: 78 HR, 100% SPO2  Pt was laying in bed utilizing room air upon arrival. Pt agreed to session and denied any feeling of nausea, dizziness, or pain. Pt did complain of fatigue however was not limited for mobility. Pt was able to get edge of bed independently and stood to RW with supervision. Pt began ambulating in hallway, 3 laps around nursing station. VC needed to keep RW close to body and Pt carried out the commands well. Pt denied SOB and weakness in LE during ambulation. Overall, Pt tolerated session well. Upon return to bed Pt denied exhaustion. Pt was left in bed with all needs in reach and alarm set.    Kathee Delton Mobility Specialist 01/18/21, 11:04 AM

## 2021-01-18 NOTE — Telephone Encounter (Signed)
Transition Care Management Unsuccessful Follow-up Telephone Call  Date of discharge and from where:  01/18/2021, [facility]  Attempts:  2nd Attempt  Reason for unsuccessful TCM follow-up call:  Unable to leave message

## 2021-01-18 NOTE — Plan of Care (Signed)
  Problem: Clinical Measurements: Goal: Ability to maintain clinical measurements within normal limits will improve Outcome: Progressing  Oxygen saturations WNL on room air.

## 2021-01-19 NOTE — Telephone Encounter (Signed)
You should try his niece [name]

## 2021-01-19 NOTE — Telephone Encounter (Signed)
Transition Care Management Unsuccessful Follow-up Telephone Call  Date of discharge and from where:  01/18/2021, [facility]  Attempts:  3rd Attempt  Reason for unsuccessful TCM follow-up call:  Unable to leave message voicemail box not setup

## 2021-01-19 NOTE — Progress Notes (Deleted)
Cardiology Office Note    Date:  01/19/2021   ID:  Adam Bentley, Wo 1924-10-06, MRN [id number]  PCP:  Venia Carbon, MD  Cardiologist:  Nelva Bush, MD  Electrophysiologist:  None   Chief Complaint: Hospital follow up  History of Present Illness:   Adam Bentley is a 87 y.o. male with history of HFrEF, ascending thoracic aortic aneurysm, HLD, hypothyroidism, colon cancer, hearing loss, BPH, and diplopia who presents for hospital follow-up as outlined below.  He was recently admitted to [facility] from 1/17 through 1/25 for a 1 week history of progressive shortness of breath in the setting of newly discovered LV systolic dysfunction consistent with HFrEF along with demand ischemia.  High-sensitivity troponin peaked at 240.  BNP initially 1927 improving to 1450 with diuresis.  CTA chest showed no evidence of PE with cardiomegaly, diffuse interstitial infiltrates, and bilateral pleural effusions.  There was also bilateral calcified pleural space consistent with asbestos-related pleural disease as well as a 4.1 cm ascending thoracic aortic aneurysm and aortic atherosclerosis noted.  Echo showed an EF of 25 to 30%, global hypokinesis, mild LVH, grade 2 diastolic function, low normal RV systolic function with normal RV cavity size, mildly dilated left atrium, degenerative mitral valve with mild to moderate regurgitation, trileaflet aortic valve with moderate calcification and thickening with mild to moderate aortic valve insufficiency and mild to moderate aortic valve sclerosis without stenosis, dilated pulmonary artery, and an estimated right atrial pressure of 15 mmHg.  Ischemia evaluation was initially advised though subsequently deferred by patient and family given his advanced age, and this was felt to be reasonable with interventional cardiology.  Given this he was medically managed and placed on GDMT.  ***   Labs independently reviewed: 12/2020 - potassium 4.7, BUN 35, serum  creatinine 0.88, magnesium 2.3, Hgb 14.2, PLT 211, TC 126, TG 81, HDL 32, LDL 78, A1c 5.2 05/2019 - TSH normal, free T4 normal, albumin 3.9, AST/ALT normal  Past Medical History:  Diagnosis Date  . BPH (benign prostatic hypertrophy)   . Dental bridge present    permanent upper  . Diplopia 1986   concussion  . Hearing aid worn    bilateral  . History of colon cancer   . Hyperlipidemia   . Hypothyroidism   . Thyroid disease   . Wears dentures    full lower    Past Surgical History:  Procedure Laterality Date  . CATARACT EXTRACTION W/PHACO Left 08/21/2017   Procedure: CATARACT EXTRACTION PHACO AND INTRAOCULAR LENS PLACEMENT ([place]) LEFT;  Surgeon: Eulogio Bear, MD;  Location: [facility];  Service: Ophthalmology;  Laterality: Left;  . CATARACT EXTRACTION W/PHACO Right 09/10/2017   Procedure: CATARACT EXTRACTION PHACO AND INTRAOCULAR LENS PLACEMENT ([place]) RIGHT;  Surgeon: Eulogio Bear, MD;  Location: [facility];  Service: Ophthalmology;  Laterality: Right;  . HERNIA REPAIR    . PARTIAL COLECTOMY  03/1999   ([place]) chemo with [place]  . TURP VAPORIZATION  1993    Current Medications: No outpatient medications have been marked as taking for the 01/24/21 encounter (Appointment) with Rise Mu, PA-C.    Allergies:   Patient has no known allergies.   Social History   Socioeconomic History  . Marital status: Single    Spouse name: Not on file  . Number of children: Not on file  . Years of education: Not on file  . Highest education level: Not on file  Occupational History  . Occupation: retired, Economist  control in engineering    Employer: RETIRED  Tobacco Use  . Smoking status: Never Smoker  . Smokeless tobacco: Never Used  Substance and Sexual Activity  . Alcohol use: Yes    Comment: 2 beers/mo  . Drug use: No  . Sexual activity: Not on file  Other Topics Concern  . Not on file  Social History Narrative   Has living will    Requests niece  [name] (or nephew Delfino Lovett) as health care power of attorney   Has DNR already from me   No tube feeds if cognitively unaware (unless clearly seems reversible)   Social Determinants of Radio broadcast assistant Strain: Not on file  Food Insecurity: Not on file  Transportation Needs: Not on file  Physical Activity: Not on file  Stress: Not on file  Social Connections: Not on file     Family History:  The patient's family history includes Dementia in his sister; Heart attack (age of onset: 34) in his mother; Lung cancer in his brother.  ROS:   ROS   EKGs/Labs/Other Studies Reviewed:    Studies reviewed were summarized above. The additional studies were reviewed today:  2D echo 01/11/2021: 1. Left ventricular ejection fraction, by estimation, is 25 to 30%. The  left ventricle has severely decreased function. The left ventricle  demonstrates global hypokinesis. There is mild left ventricular  hypertrophy. Left ventricular diastolic parameters  are consistent with Grade II diastolic dysfunction (pseudonormalization).  Elevated left atrial pressure. The average left ventricular global  longitudinal strain is -7.3 %. The global longitudinal strain is abnormal.  2. Right ventricular systolic function is low normal. The right  ventricular size is normal. Tricuspid regurgitation signal is inadequate  for assessing PA pressure.  3. Left atrial size was mildly dilated.  4. The mitral valve is degenerative. Mild to moderate mitral valve  regurgitation. No evidence of mitral stenosis.  5. The aortic valve is tricuspid. There is moderate calcification of the  aortic valve. There is moderate thickening of the aortic valve. Aortic  valve regurgitation is mild to moderate. Mild to moderate aortic valve  sclerosis/calcification is present,  without any evidence of aortic stenosis.  6. Mildly dilated pulmonary artery.  7. The inferior vena cava is dilated in size with <50% respiratory   variability, suggesting right atrial pressure of 15 mmHg.   EKG:  EKG is ordered today.  The EKG ordered today demonstrates ***  Recent Labs: 06/02/2020: ALT 16; TSH 0.43 01/12/2021: Hemoglobin 14.2; Magnesium 2.3; Platelets 211 01/13/2021: B Natriuretic Peptide 1,450.6 01/15/2021: BUN 35; Creatinine, Ser 0.88; Sodium 134 01/17/2021: Potassium 4.7  Recent Lipid Panel    Component Value Date/Time   CHOL 126 01/11/2021 0309   TRIG 81 01/11/2021 0309   HDL 32 (L) 01/11/2021 0309   CHOLHDL 3.9 01/11/2021 0309   VLDL 16 01/11/2021 0309   LDLCALC 78 01/11/2021 0309    PHYSICAL EXAM:    VS:  There were no vitals taken for this visit.  BMI: There is no height or weight on file to calculate BMI.  Physical Exam  Wt Readings from Last 3 Encounters:  01/18/21 132 lb 14.4 oz (60.3 kg)  06/02/20 144 lb (65.3 kg)  05/27/19 155 lb (70.3 kg)     ASSESSMENT & PLAN:   1. HFrEF:  ***  2. Ascending thoracic aortic aneurysm:  3. HLD: LDL 78 in 12/2020 with normal LFT in 05/2019.  Disposition: F/u with Dr. Saunders Revel or an APP in ***.  Medication Adjustments/Labs and Tests Ordered: Current medicines are reviewed at length with the patient today.  Concerns regarding medicines are outlined above. Medication changes, Labs and Tests ordered today are summarized above and listed in the Patient Instructions accessible in Encounters.   Signed, Christell Faith, PA-C 01/19/2021 9:59 AM     [address] [place], [place] [address] [phone]

## 2021-01-20 DIAGNOSIS — K5900 Constipation, unspecified: Secondary | ICD-10-CM | POA: Diagnosis not present

## 2021-01-20 DIAGNOSIS — R0600 Dyspnea, unspecified: Secondary | ICD-10-CM | POA: Diagnosis not present

## 2021-01-20 DIAGNOSIS — I509 Heart failure, unspecified: Secondary | ICD-10-CM | POA: Diagnosis not present

## 2021-01-20 DIAGNOSIS — E039 Hypothyroidism, unspecified: Secondary | ICD-10-CM | POA: Diagnosis not present

## 2021-01-20 DIAGNOSIS — E785 Hyperlipidemia, unspecified: Secondary | ICD-10-CM | POA: Diagnosis not present

## 2021-01-20 DIAGNOSIS — R531 Weakness: Secondary | ICD-10-CM | POA: Diagnosis not present

## 2021-01-21 DIAGNOSIS — R278 Other lack of coordination: Secondary | ICD-10-CM | POA: Diagnosis not present

## 2021-01-21 DIAGNOSIS — M6281 Muscle weakness (generalized): Secondary | ICD-10-CM | POA: Diagnosis not present

## 2021-01-21 DIAGNOSIS — R2689 Other abnormalities of gait and mobility: Secondary | ICD-10-CM | POA: Diagnosis not present

## 2021-01-24 ENCOUNTER — Ambulatory Visit: Payer: Medicare Other | Admitting: Physician Assistant

## 2021-01-24 DIAGNOSIS — R278 Other lack of coordination: Secondary | ICD-10-CM | POA: Diagnosis not present

## 2021-01-24 DIAGNOSIS — E119 Type 2 diabetes mellitus without complications: Secondary | ICD-10-CM | POA: Diagnosis not present

## 2021-01-24 DIAGNOSIS — D518 Other vitamin B12 deficiency anemias: Secondary | ICD-10-CM | POA: Diagnosis not present

## 2021-01-24 DIAGNOSIS — U071 COVID-19: Secondary | ICD-10-CM | POA: Diagnosis not present

## 2021-01-24 DIAGNOSIS — M6281 Muscle weakness (generalized): Secondary | ICD-10-CM | POA: Diagnosis not present

## 2021-01-24 DIAGNOSIS — E038 Other specified hypothyroidism: Secondary | ICD-10-CM | POA: Diagnosis not present

## 2021-01-24 DIAGNOSIS — Z79899 Other long term (current) drug therapy: Secondary | ICD-10-CM | POA: Diagnosis not present

## 2021-01-24 DIAGNOSIS — Z20828 Contact with and (suspected) exposure to other viral communicable diseases: Secondary | ICD-10-CM | POA: Diagnosis not present

## 2021-01-24 DIAGNOSIS — R2689 Other abnormalities of gait and mobility: Secondary | ICD-10-CM | POA: Diagnosis not present

## 2021-01-24 NOTE — Progress Notes (Deleted)
   Patient ID: Adam Bentley, male    DOB: [birth year], 87 y.o.   MRN: [id number]  HPI  Mr Reichart is a 87 y/o male with a history of  Echo report from 01/11/21 reviewed and showed an EF of 25-30% along with mild/moderate MR/TR.   Admitted 01/10/21 due to shortness of breath. COVID negative. CT angiogram negative for PE but did show pulmonary edema and 4.1 cm ascending thoracic aortic aneurysm. Cardiology and palliative care consults obtained. Initially given IV lasix with transition with oral diuretics. Weaned off oxygen down to room air. Elevated troponin thought to be due to demand ischemia. Discharged after 8 days.   He presents today for his initial visit with a chief complaint of   Review of Systems    Physical Exam  Assessment & Plan:  1: Chronic heart failure with reduced ejection fraction- - NYHA class - BNP 01/13/21 was 1450.6  2: Hyperlipidemia- - saw PCP Silvio Pate) 06/02/20 - BMP 01/15/21 reviewed and showed sodium 134, potassium 3.7, creatinine 0.88 and GFR >60

## 2021-01-25 ENCOUNTER — Encounter: Payer: Self-pay | Admitting: Physician Assistant

## 2021-01-25 ENCOUNTER — Telehealth: Payer: Self-pay | Admitting: Family

## 2021-01-25 ENCOUNTER — Ambulatory Visit: Payer: Medicare Other | Admitting: Family

## 2021-01-25 NOTE — Telephone Encounter (Signed)
Patient did not show for his [facility] Failure Clinic appointment on 01/25/21. Will attempt to reschedule.  

## 2021-01-26 DIAGNOSIS — R278 Other lack of coordination: Secondary | ICD-10-CM | POA: Diagnosis not present

## 2021-01-26 DIAGNOSIS — R2689 Other abnormalities of gait and mobility: Secondary | ICD-10-CM | POA: Diagnosis not present

## 2021-01-26 DIAGNOSIS — M6281 Muscle weakness (generalized): Secondary | ICD-10-CM | POA: Diagnosis not present

## 2021-01-31 DIAGNOSIS — M6281 Muscle weakness (generalized): Secondary | ICD-10-CM | POA: Diagnosis not present

## 2021-01-31 DIAGNOSIS — R278 Other lack of coordination: Secondary | ICD-10-CM | POA: Diagnosis not present

## 2021-01-31 DIAGNOSIS — U071 COVID-19: Secondary | ICD-10-CM | POA: Diagnosis not present

## 2021-01-31 DIAGNOSIS — Z20828 Contact with and (suspected) exposure to other viral communicable diseases: Secondary | ICD-10-CM | POA: Diagnosis not present

## 2021-01-31 DIAGNOSIS — R2689 Other abnormalities of gait and mobility: Secondary | ICD-10-CM | POA: Diagnosis not present

## 2021-02-03 DIAGNOSIS — R2689 Other abnormalities of gait and mobility: Secondary | ICD-10-CM | POA: Diagnosis not present

## 2021-02-03 DIAGNOSIS — K5900 Constipation, unspecified: Secondary | ICD-10-CM | POA: Diagnosis not present

## 2021-02-03 DIAGNOSIS — L89302 Pressure ulcer of unspecified buttock, stage 2: Secondary | ICD-10-CM | POA: Diagnosis not present

## 2021-02-03 DIAGNOSIS — E538 Deficiency of other specified B group vitamins: Secondary | ICD-10-CM | POA: Diagnosis not present

## 2021-02-03 DIAGNOSIS — I509 Heart failure, unspecified: Secondary | ICD-10-CM | POA: Diagnosis not present

## 2021-02-03 DIAGNOSIS — R531 Weakness: Secondary | ICD-10-CM | POA: Diagnosis not present

## 2021-02-03 DIAGNOSIS — R278 Other lack of coordination: Secondary | ICD-10-CM | POA: Diagnosis not present

## 2021-02-03 DIAGNOSIS — M6281 Muscle weakness (generalized): Secondary | ICD-10-CM | POA: Diagnosis not present

## 2021-02-03 DIAGNOSIS — I10 Essential (primary) hypertension: Secondary | ICD-10-CM | POA: Diagnosis not present

## 2021-02-03 DIAGNOSIS — R0600 Dyspnea, unspecified: Secondary | ICD-10-CM | POA: Diagnosis not present

## 2021-02-04 ENCOUNTER — Ambulatory Visit: Payer: Medicare Other | Admitting: Family

## 2021-02-04 ENCOUNTER — Telehealth: Payer: Self-pay | Admitting: Family

## 2021-02-22 NOTE — Telephone Encounter (Signed)
Patient did not show for his [facility] Failure Clinic appointment on 02-15-2021. Will attempt to reschedule.

## 2021-02-22 NOTE — Telephone Encounter (Signed)
Unable to reach patient in attempt to reschedule a no show appointment for the CHF Clinic.   Adam Bentley, NT

## 2021-02-22 DEATH — deceased

## 2021-06-07 ENCOUNTER — Ambulatory Visit: Payer: Medicare Other | Admitting: Internal Medicine
